# Patient Record
Sex: Female | Born: 1945 | Race: White | Hispanic: Refuse to answer | Smoking: Never smoker
Health system: Southern US, Community
[De-identification: ages and names within clinical notes are randomized; demographics above are authoritative.]

## PROBLEM LIST (undated history)

## (undated) DIAGNOSIS — M1990 Unspecified osteoarthritis, unspecified site: Secondary | ICD-10-CM

## (undated) DIAGNOSIS — K219 Gastro-esophageal reflux disease without esophagitis: Secondary | ICD-10-CM

## (undated) DIAGNOSIS — N816 Rectocele: Secondary | ICD-10-CM

## (undated) DIAGNOSIS — M810 Age-related osteoporosis without current pathological fracture: Secondary | ICD-10-CM

## (undated) DIAGNOSIS — R519 Headache, unspecified: Secondary | ICD-10-CM

## (undated) DIAGNOSIS — E785 Hyperlipidemia, unspecified: Secondary | ICD-10-CM

## (undated) DIAGNOSIS — N368 Other specified disorders of urethra: Secondary | ICD-10-CM

## (undated) DIAGNOSIS — D1803 Hemangioma of intra-abdominal structures: Secondary | ICD-10-CM

## (undated) DIAGNOSIS — F329 Major depressive disorder, single episode, unspecified: Secondary | ICD-10-CM

## (undated) DIAGNOSIS — R51 Headache: Secondary | ICD-10-CM

## (undated) DIAGNOSIS — F32A Depression, unspecified: Secondary | ICD-10-CM

## (undated) HISTORY — DX: Hyperlipidemia, unspecified: E78.5

## (undated) HISTORY — PX: RECTOCELE REPAIR: SHX761

## (undated) HISTORY — PX: BREAST REDUCTION SURGERY: SHX8

## (undated) HISTORY — PX: ABDOMINAL HYSTERECTOMY: SHX81

## (undated) HISTORY — PX: OTHER SURGICAL HISTORY: SHX169

---

## 2004-04-01 ENCOUNTER — Ambulatory Visit: Payer: Self-pay | Admitting: Pain Medicine

## 2004-06-18 ENCOUNTER — Ambulatory Visit: Payer: Self-pay | Admitting: Pain Medicine

## 2004-09-15 ENCOUNTER — Ambulatory Visit: Payer: Self-pay | Admitting: Pain Medicine

## 2004-12-08 ENCOUNTER — Ambulatory Visit: Payer: Self-pay | Admitting: Pain Medicine

## 2005-02-03 ENCOUNTER — Ambulatory Visit: Payer: Self-pay | Admitting: Pain Medicine

## 2005-02-04 ENCOUNTER — Ambulatory Visit: Payer: Self-pay | Admitting: Chiropractic Medicine

## 2005-04-06 ENCOUNTER — Ambulatory Visit: Payer: Self-pay | Admitting: Pain Medicine

## 2005-04-12 ENCOUNTER — Ambulatory Visit: Payer: Self-pay | Admitting: Pain Medicine

## 2005-05-07 ENCOUNTER — Inpatient Hospital Stay (HOSPITAL_COMMUNITY): Admission: RE | Admit: 2005-05-07 | Discharge: 2005-05-10 | Payer: Self-pay | Admitting: Neurosurgery

## 2005-06-10 ENCOUNTER — Encounter: Admission: RE | Admit: 2005-06-10 | Discharge: 2005-06-10 | Payer: Self-pay | Admitting: Neurosurgery

## 2006-01-06 ENCOUNTER — Ambulatory Visit: Payer: Self-pay | Admitting: Gastroenterology

## 2006-02-16 ENCOUNTER — Encounter: Payer: Self-pay | Admitting: Unknown Physician Specialty

## 2006-02-26 ENCOUNTER — Encounter: Payer: Self-pay | Admitting: Unknown Physician Specialty

## 2006-03-28 ENCOUNTER — Encounter: Payer: Self-pay | Admitting: Unknown Physician Specialty

## 2006-05-22 ENCOUNTER — Emergency Department: Payer: Self-pay | Admitting: Emergency Medicine

## 2006-05-22 ENCOUNTER — Other Ambulatory Visit: Payer: Self-pay

## 2006-05-23 ENCOUNTER — Ambulatory Visit: Payer: Self-pay | Admitting: Emergency Medicine

## 2006-11-15 ENCOUNTER — Ambulatory Visit: Payer: Self-pay | Admitting: Pain Medicine

## 2006-12-19 ENCOUNTER — Ambulatory Visit: Payer: Self-pay | Admitting: Pain Medicine

## 2007-01-12 ENCOUNTER — Ambulatory Visit: Payer: Self-pay | Admitting: Pain Medicine

## 2007-01-30 ENCOUNTER — Ambulatory Visit: Payer: Self-pay | Admitting: Pain Medicine

## 2007-02-16 ENCOUNTER — Ambulatory Visit: Payer: Self-pay | Admitting: Pain Medicine

## 2007-04-10 ENCOUNTER — Ambulatory Visit: Payer: Self-pay | Admitting: Pain Medicine

## 2007-05-11 ENCOUNTER — Ambulatory Visit: Payer: Self-pay | Admitting: Pain Medicine

## 2007-06-06 ENCOUNTER — Ambulatory Visit: Payer: Self-pay | Admitting: Pain Medicine

## 2007-07-03 ENCOUNTER — Ambulatory Visit: Payer: Self-pay | Admitting: Pain Medicine

## 2007-08-03 ENCOUNTER — Ambulatory Visit: Payer: Self-pay | Admitting: Pain Medicine

## 2007-08-09 ENCOUNTER — Ambulatory Visit: Payer: Self-pay | Admitting: Pain Medicine

## 2007-09-19 ENCOUNTER — Ambulatory Visit: Payer: Self-pay | Admitting: Pain Medicine

## 2007-09-27 ENCOUNTER — Ambulatory Visit: Payer: Self-pay | Admitting: Pain Medicine

## 2007-10-10 ENCOUNTER — Ambulatory Visit: Payer: Self-pay | Admitting: Pain Medicine

## 2007-11-04 ENCOUNTER — Ambulatory Visit: Payer: Self-pay | Admitting: Family Medicine

## 2007-11-07 ENCOUNTER — Ambulatory Visit: Payer: Self-pay | Admitting: Pain Medicine

## 2007-11-14 ENCOUNTER — Ambulatory Visit: Payer: Self-pay | Admitting: Pain Medicine

## 2007-12-14 ENCOUNTER — Encounter: Admission: RE | Admit: 2007-12-14 | Discharge: 2007-12-14 | Payer: Self-pay | Admitting: Orthopedic Surgery

## 2007-12-15 ENCOUNTER — Ambulatory Visit (HOSPITAL_COMMUNITY): Admission: RE | Admit: 2007-12-15 | Discharge: 2007-12-15 | Payer: Self-pay | Admitting: Radiology

## 2009-07-04 ENCOUNTER — Emergency Department: Payer: Self-pay | Admitting: Emergency Medicine

## 2010-02-16 ENCOUNTER — Ambulatory Visit: Payer: Self-pay | Admitting: Cardiovascular Disease

## 2010-02-16 DIAGNOSIS — R9431 Abnormal electrocardiogram [ECG] [EKG]: Secondary | ICD-10-CM

## 2010-02-16 DIAGNOSIS — E785 Hyperlipidemia, unspecified: Secondary | ICD-10-CM

## 2010-02-17 ENCOUNTER — Encounter: Payer: Self-pay | Admitting: Cardiovascular Disease

## 2010-02-17 ENCOUNTER — Telehealth: Payer: Self-pay | Admitting: Cardiovascular Disease

## 2010-02-24 ENCOUNTER — Encounter: Payer: Self-pay | Admitting: Cardiovascular Disease

## 2010-07-28 NOTE — Assessment & Plan Note (Signed)
Summary: NP6/AMD   Visit Type:  Initial Consult Primary Provider:  Almyra Brace.  CC:  Pre op for fusion of lumbar..  History of Present Illness: Angela Ayala is a very pleasant 65 year old woman with a history of hyperlipidemia, strong family history of coronary artery disease with her father who had his first MI at age 57 bili was a lifetime smoker. She had a fall in 2008 and since that time has had chronic back pain. She is due to have a lumbar fusion and presents for preoperative evaluation given the abnormal EKG.  Overall, she reports that she has been doing well. She denies any significant symptoms of shortness of breath, lightheadedness, chest pain. She is active and does what she can with mild limitations from her back. She denies any recent change in her ability to exert herself and states that everything is about the same. She sleeps well, On a flat surface with no orthopnea. Energy is good.  EKG in our office shows normal sinus rhythm with rate of 84 beats per minute, nonspecific ST and T wave changes in leads V3 through V6, leads 3 and aVF. These EKG changes were also seen previously in 03/2007    Preventive Screening-Counseling & Management  Alcohol-Tobacco     Smoking Status: never  Caffeine-Diet-Exercise     Does Patient Exercise: no      Drug Use:  no.    Current Medications (verified): 1)  Calcium Gluconate 500 Mg Tabs (Calcium Gluconate) .... One Tablet Two Times A Day 2)  Ra Fish Oil 1000 Mg Caps (Omega-3 Fatty Acids) .... One Tablet Two Times A Day 3)  Centrum  Tabs (Multiple Vitamins-Minerals) .... One Tablet Once Daily 4)  Nasonex 50 Mcg/act Susp (Mometasone Furoate) .... Prn 5)  Omeprazole 20 Mg Cpdr (Omeprazole) .... One Tablet Once Daily 6)  Premarin 0.625 Mg/gm Crea (Estrogens, Conjugated) .... As Needed 7)  Skelaxin 800 Mg Tabs (Metaxalone) .... 2-3 Tablets Once Daily 8)  Aspirin 81 Mg Tbec (Aspirin) .... One Tablet At Bedtime 9)  Tramadol Hcl 50 Mg  Tabs (Tramadol Hcl) .... One Tablet Every Six Hours As Needed 10)  Simvastatin 40 Mg Tabs (Simvastatin) .... One Tablet At Bedtime 11)  Meloxicam 15 Mg Tabs (Meloxicam) .... One Tablet Once Daily 12)  Imitrex 50 Mg Tabs (Sumatriptan Succinate) .... As Needed  Allergies (verified): 1)  ! Pcn  Past History:  Family History: Last updated: 02/16/2010 Family History of Coronary Artery Disease: Father MI age 36  Social History: Last updated: 02/16/2010 Tobacco Use - No.  Alcohol Use - yes Regular Exercise - no Drug Use - no Disabled --  Married   Risk Factors: Exercise: no (02/16/2010)  Risk Factors: Smoking Status: never (02/16/2010)  Past Medical History: Hyperlipidemia  Past Surgical History: fusion of lumbar L4/L5 breast reduciton  Family History: Family History of Coronary Artery Disease: Father MI age 63  Social History: Tobacco Use - No.  Alcohol Use - yes Regular Exercise - no Drug Use - no Disabled --  Married  Smoking Status:  never Does Patient Exercise:  no Drug Use:  no  Review of Systems  The patient denies fever, weight loss, weight gain, vision loss, decreased hearing, hoarseness, chest pain, syncope, dyspnea on exertion, peripheral edema, prolonged cough, abdominal pain, incontinence, muscle weakness, depression, and enlarged lymph nodes.         chronic back pain  Vital Signs:  Patient profile:   65 year old female Height:  66 inches Weight:      128 pounds BMI:     20.73 Pulse rate:   77 / minute BP sitting:   136 / 84  (left arm) Cuff size:   regular  Vitals Entered By: Bishop Dublin, CMA (February 16, 2010 2:55 PM)  Physical Exam  General:  Well developed, well nourished, in no acute distress. Head:  normocephalic and atraumatic Neck:  Neck supple, no JVD. No masses, thyromegaly or abnormal cervical nodes. Lungs:  Clear bilaterally to auscultation and percussion. Heart:  Non-displaced PMI, chest non-tender; regular rate and  rhythm, S1, S2 without murmurs, rubs or gallops. Carotid upstroke normal, no bruit.  Pedals normal pulses. No edema, no varicosities. Abdomen:   abdomen soft and non-tender without masses Msk:  Back normal, normal gait. Muscle strength and tone normal. Pulses:  pulses normal in all 4 extremities Extremities:  No clubbing or cyanosis. Neurologic:  Alert and oriented x 3. Skin:  Intact without lesions or rashes. Psych:  Normal affect.   Impression & Recommendations:  Problem # 1:  ABNORMAL EKG (ICD-794.31) EKG from 2008 and on today's visit shows nonspecific ST and T wave changes diffusely. She is asymptomatic, has good exercise tolerance. She does have some symptoms of shortness of breath with heavy exertion this has been stable. No new symptoms concerning for angina. We will not have her complete any additional workup at this time. Per the new guidelines, she would be low/acceptable risk. We have suggested that if she has symptoms in the future concerning for heart disease, she should contact our office for further evaluation.  Her updated medication list for this problem includes:    Aspirin 81 Mg Tbec (Aspirin) ..... One tablet at bedtime  Problem # 2:  HYPERLIPIDEMIA-MIXED (ICD-272.4) She has been on simvastatin for 2-3 years. She reports that her total cholesterol continues to be elevated. One option would be for her to start a stronger medication such as Crestor 20 mg daily. We have given A. co-pay assistance card and she will see if she qualifies for this. goal total cholesterol would be certainly less than 200, preferably closer to 150.  Her updated medication list for this problem includes:    Simvastatin 40 Mg Tabs (Simvastatin) ..... One tablet at bedtime  Other Orders: EKG w/ Interpretation (93000)  Patient Instructions: 1)  Your physician has recommended you make the following change in your medication:  2)  Your physician wants you to follow-up in:   You will receive  a reminder letter in the mail two months in advance. If you don't receive a letter, please call our office to schedule the follow-up appointment.

## 2010-07-28 NOTE — Letter (Signed)
Summary: PHI  PHI   Imported By: Harlon Flor 02/17/2010 14:59:09  _____________________________________________________________________  External Attachment:    Type:   Image     Comment:   External Document

## 2010-07-28 NOTE — Letter (Signed)
Summary: Medical Record Release  Medical Record Release   Imported By: Harlon Flor 03/16/2010 12:57:15  _____________________________________________________________________  External Attachment:    Type:   Image     Comment:   External Document

## 2010-07-28 NOTE — Letter (Signed)
Summary: Clearance Letter  Architectural technologist at [facility] [address]   [place], [place] [address]   Phone: [phone]  Fax: [phone]    February 17, 2010  Re:     Angela Ayala Address:   [address]     [place], [place]  [address] DOB:     02-Jun-1946 MRN:     [id number]   Dear Dr. Elvina Sidle,  The above named patient was seen in my office February 16, 2010 and has been cleared for surgery. If you have any questions you can call my office. Thank you.        Sincerely,       Dossie Arbour, MD

## 2010-07-28 NOTE — Progress Notes (Signed)
Summary: rx crestor  Phone Note Refill Request Call back at Home Phone [phone] Message from:  Patient on February 17, 2010 2:18 PM  [facility] also like samples  Initial call taken by: Harlon Flor,  February 17, 2010 2:19 PM  Follow-up for Phone Call        Rx called to pharmacy Follow-up by: Bishop Dublin, CMA,  February 17, 2010 3:22 PM    New/Updated Medications: CRESTOR 20 MG TABS (ROSUVASTATIN CALCIUM) one tablet at bedtime Prescriptions: CRESTOR 20 MG TABS (ROSUVASTATIN CALCIUM) one tablet at bedtime  #30 x 6   Entered by:   Bishop Dublin, CMA   Authorized by:   Dossie Arbour MD   Signed by:   Bishop Dublin, CMA on 02/17/2010   Method used:   Electronically to        [facility] [address] (retail)       [address]       [place], [place]  [address]       Ph: [phone]       Fax: [phone]   RxID:   [id number]

## 2010-11-13 NOTE — Op Note (Signed)
Angela Ayala, Angela Ayala NO.:  [id number]   MEDICAL RECORD NO.:  [id number]          PATIENT TYPE:  INP   LOCATION:  2899                         FACILITY:  [facility]   PHYSICIAN:  Kathaleen Maser. Pool, M.D.    DATE OF BIRTH:  1945-10-07   DATE OF PROCEDURE:  05/07/2005  DATE OF DISCHARGE:                                 OPERATIVE REPORT   SERVICE:  Neurosurgery.   PREOPERATIVE DIAGNOSES:  L4-L5 degenerative spondylolisthesis with stenosis.   POSTOPERATIVE DIAGNOSIS:  L4-L5 degenerative spondylolisthesis with  stenosis.   PROCEDURE:  L4-L5 decompressive laminectomy with foraminotomy, more than  would be required for simple interbody fusion, alone, L4-L5 posterior lumbar  interbody fusion utilizing Telamon interbody cage, local autografting and  Tangent interbody allograft wedge, L4-L5 posterolateral arthrodesis  utilizing intertransverse bone grafting utilizing local autograft and  nonsegmental pedicle screw instrumentation.   SURGEON:  Kathaleen Maser. Pool, M.D.   ASSISTANT:  Donalee Citrin, M.D.   ANESTHESIA:  General endotracheal.   INDICATIONS FOR PROCEDURE:  Angela Ayala is a 65 year old female with a history  of back and bilateral lower extremity pain failing conservative management.  Workup demonstrates evidence of an L4-L5 grade 1 degenerative  spondylolisthesis with moderately severe stenosis.  The patient has been  counseled as to her options.  She has decided to proceed with an L4-L5  decompression and fusion with instrumentation in hopes of improving  symptoms.   DESCRIPTION OF PROCEDURE:  The patient was brought to the operating room and  placed on the table in a supine position.  After an adequate level of  anesthesia was achieved, the patient was positioned prone on the Wilson  frame and properly padded for operation in the lumbar region.  She was  prepped and draped sterilely.  A 10 blade was used to make a linear skin  incision overlying the L4-L5 interspace.   This was carried down sharply in  the midline.  Subperiosteal dissection was performed exposing the lamina and  facet joints of L3, L4, L5, as well as the transverse processes of L4 and  L5.  Deep self-retaining retractors were placed.  Interoperative fluoroscopy  was used and the L4-L5 level was confirmed.  Decompressive laminectomies  were then performed Leksell rongeurs, Kerrison rongeurs, and a high speed  drill to remove the entire lamina of L4, the entire facet joint of L4  bilaterally, and the superior facet of L5 bilaterally.  Partial superior  laminectomy at L5 was also performed.  All bone is cleaned and used in later  autografting.  The ligamentum flavum was then elevated and resected in a  piecemeal fashion using the Kerrison rongeurs.  There was an underlying  synovial cyst off on the right side at L4-L5 which was also resected.  After  wide foraminotomy was performed along the course of the exiting L4 and L5  nerve roots, epidural venous plexus was coagulated and cut.  Starting first  on the patient's right side, the thecal sac and nerve roots were protected.  The disc space was incised with a 15 blade in rectangular  fashion and wide  disc space clean out was then achieved using pituitary rongeurs, up angled  pituitary rongeurs, and Epstein curets.  The discectomy was then repeated on  the contralateral side.  The disc space was then distracted up to 8 mm with  an 8 mm distractor left in the patient's right side.  The thecal sac and  nerve roots were protected on the left side.  The disc space was then reamed  and then cut with the 8 mm Tangent instruments.  Soft tissue was removed  from the interspace.  An 8 by 26 Telamon cage packed with local autograft  mixed with DBX was then impacted in place and recessed approximately 2 mm  from the posterior cortical margin.  The distractor was moved to the  patient's right side.  The thecal sac and nerve roots were protected on the   right side.  The disc space was then reamed and cut with 8 mm Tangent  instruments.  Soft tissue was once again removed from the interspace.  The  disc space was further curettaged.  Morselized autograft was then packed in  the interspace.  An 8 by 26 mm Tangent wedge was then impacted into place  and recessed approximately 2 mm posterior to the cortical margin.  The  pedicles at L4 and L5 were then identified utilizing superficial landmarks  and interoperative fluoroscopy.  The superficial bone overlying the pedicle  was then removed using the high speed drill.  Each pedicle was then probed  using pedicle awl.  Each pedicle awl track was then probed and found to be  solid in bone.  Each pedicle awl track was then tapped with a 5.25 mm screw  tap.  Each screw tap hole was probed and found to be solid in bone.  5.75 by  40 mm screws were placed bilaterally at L4, 5.75 by 35 mm screws were placed  bilaterally at L5.  The transverse process at L4 and L5 were then  decorticated using the high speed drill.  Morselized autograft was packed  posterolaterally for later fusion.  A short segment of titanium rod was then  contoured and placed through the screw head of L4 and L5.  A locking cap was  then placed over the screw heads.  The locking caps were then engaged with  the construct under compression.  Final imaging revealed good position of  the bone graft hardware.  The wound was then irrigated with antibiotic  solution.  Gelfoam was placed topically for hemostasis which was found to be  good.  A medium Hemovac drain was left in the epidural space.  The wound was  then closed in layers with Vicryl sutures.  Steri-Strips and sterile  dressing were applied.  There were no complications.  The patient tolerated  the procedure well and she was returned to the recovery room in stable  condition.           ______________________________ Kathaleen Maser Pool, M.D.     HAP/MEDQ  D:  05/07/2005  T:   05/07/2005  Job:  [id number]

## 2010-11-13 NOTE — Discharge Summary (Signed)
NAMECAPRINA, Angela Ayala                  ACCOUNT NO.:  [id number]   MEDICAL RECORD NO.:  [id number]          PATIENT TYPE:  INP   LOCATION:  3003                         FACILITY:  [facility]   PHYSICIAN:  Kathaleen Maser. Pool, M.D.    DATE OF BIRTH:  1945-12-06   DATE OF ADMISSION:  05/07/2005  DATE OF DISCHARGE:  05/10/2005                                 DISCHARGE SUMMARY   FINAL DIAGNOSIS:  L4-5 degenerative spondylolisthesis with stenosis.   HISTORY OF PRESENT ILLNESS:  [name] is a 65 year old female with a  history of back and bilateral lower extremity pain failing conservative  management.  She presents now for L4-5 decompression and fusion for  treatment of a degenerative spondylolisthesis.   HOSPITAL COURSE:  The patient was taken to the operating room where an  uncomplicated L4-5 decompression and fusion were performed.  Postoperatively, the patient did well.  Her activity level was gradually  increased.  At the time of discharge, she is ambulating without difficulty.  Preoperative pain was much improved.  Strength and sensation were intact.  The wound was healing well.   CONDITION ON DISCHARGE:  Improved.   DISCHARGE DISPOSITION:  The patient will follow up in one week in my office.           ______________________________  Kathaleen Maser. Pool, M.D.     HAP/MEDQ  D:  07/06/2005  T:  07/06/2005  Job:  [id number]

## 2011-01-14 ENCOUNTER — Encounter: Payer: Self-pay | Admitting: Cardiovascular Disease

## 2011-07-15 ENCOUNTER — Ambulatory Visit: Payer: Self-pay | Admitting: Gastroenterology

## 2011-09-09 ENCOUNTER — Ambulatory Visit: Payer: Self-pay | Admitting: Physical Medicine and Rehabilitation

## 2012-07-12 ENCOUNTER — Ambulatory Visit: Payer: Self-pay | Admitting: Gastroenterology

## 2013-02-13 DIAGNOSIS — M222X9 Patellofemoral disorders, unspecified knee: Secondary | ICD-10-CM | POA: Insufficient documentation

## 2013-05-14 DIAGNOSIS — G8929 Other chronic pain: Secondary | ICD-10-CM | POA: Insufficient documentation

## 2013-05-14 DIAGNOSIS — M79601 Pain in right arm: Secondary | ICD-10-CM | POA: Insufficient documentation

## 2013-06-29 ENCOUNTER — Ambulatory Visit: Payer: Self-pay | Admitting: Medical

## 2013-06-29 LAB — RAPID INFLUENZA A&B ANTIGENS

## 2014-01-21 DIAGNOSIS — K5900 Constipation, unspecified: Secondary | ICD-10-CM | POA: Insufficient documentation

## 2014-01-21 DIAGNOSIS — IMO0002 Reserved for concepts with insufficient information to code with codable children: Secondary | ICD-10-CM | POA: Insufficient documentation

## 2014-01-21 DIAGNOSIS — N816 Rectocele: Secondary | ICD-10-CM | POA: Insufficient documentation

## 2014-02-20 DIAGNOSIS — M51369 Other intervertebral disc degeneration, lumbar region without mention of lumbar back pain or lower extremity pain: Secondary | ICD-10-CM | POA: Insufficient documentation

## 2014-02-20 DIAGNOSIS — M5136 Other intervertebral disc degeneration, lumbar region: Secondary | ICD-10-CM | POA: Insufficient documentation

## 2014-02-20 DIAGNOSIS — M5416 Radiculopathy, lumbar region: Secondary | ICD-10-CM | POA: Insufficient documentation

## 2014-12-18 ENCOUNTER — Other Ambulatory Visit: Payer: Self-pay | Admitting: Orthopedic Surgery

## 2014-12-18 DIAGNOSIS — M542 Cervicalgia: Secondary | ICD-10-CM

## 2014-12-18 DIAGNOSIS — IMO0001 Reserved for inherently not codable concepts without codable children: Secondary | ICD-10-CM

## 2014-12-18 DIAGNOSIS — R209 Unspecified disturbances of skin sensation: Secondary | ICD-10-CM

## 2014-12-24 ENCOUNTER — Ambulatory Visit
Admission: RE | Admit: 2014-12-24 | Discharge: 2014-12-24 | Disposition: A | Discharge status: Home / Self Care | Payer: Medicare Other | Source: Ambulatory Visit | Attending: Orthopedic Surgery | Admitting: Orthopedic Surgery

## 2014-12-24 DIAGNOSIS — R209 Unspecified disturbances of skin sensation: Secondary | ICD-10-CM

## 2014-12-24 DIAGNOSIS — M542 Cervicalgia: Secondary | ICD-10-CM | POA: Diagnosis present

## 2014-12-24 DIAGNOSIS — IMO0001 Reserved for inherently not codable concepts without codable children: Secondary | ICD-10-CM

## 2014-12-24 DIAGNOSIS — M4802 Spinal stenosis, cervical region: Secondary | ICD-10-CM | POA: Insufficient documentation

## 2015-01-07 ENCOUNTER — Other Ambulatory Visit: Payer: Self-pay | Admitting: *Deleted

## 2016-03-11 ENCOUNTER — Encounter: Admission: RE | Payer: Self-pay | Source: Ambulatory Visit

## 2016-03-11 ENCOUNTER — Ambulatory Visit
Admission: RE | Admit: 2016-03-11 | Payer: Medicare Other | Source: Ambulatory Visit | Admitting: Unknown Physician Specialty

## 2016-03-11 SURGERY — COLONOSCOPY WITH PROPOFOL
Anesthesia: General

## 2016-04-08 ENCOUNTER — Encounter: Payer: Self-pay | Admitting: *Deleted

## 2016-04-12 ENCOUNTER — Encounter: Payer: Self-pay | Admitting: *Deleted

## 2016-04-12 ENCOUNTER — Ambulatory Visit
Admission: RE | Admit: 2016-04-12 | Discharge: 2016-04-12 | Disposition: A | Discharge status: Home / Self Care | Payer: Medicare Other | Source: Ambulatory Visit | Attending: Unknown Physician Specialty | Admitting: Unknown Physician Specialty

## 2016-04-12 ENCOUNTER — Ambulatory Visit: Payer: Medicare Other | Admitting: Anesthesiology

## 2016-04-12 ENCOUNTER — Encounter
Admission: RE | Disposition: A | Discharge status: Home / Self Care | Payer: Self-pay | Source: Ambulatory Visit | Attending: Unknown Physician Specialty

## 2016-04-12 DIAGNOSIS — D123 Benign neoplasm of transverse colon: Secondary | ICD-10-CM | POA: Diagnosis not present

## 2016-04-12 DIAGNOSIS — D122 Benign neoplasm of ascending colon: Secondary | ICD-10-CM | POA: Insufficient documentation

## 2016-04-12 DIAGNOSIS — K2970 Gastritis, unspecified, without bleeding: Secondary | ICD-10-CM | POA: Insufficient documentation

## 2016-04-12 DIAGNOSIS — Z7982 Long term (current) use of aspirin: Secondary | ICD-10-CM | POA: Diagnosis not present

## 2016-04-12 DIAGNOSIS — T184XXA Foreign body in colon, initial encounter: Secondary | ICD-10-CM | POA: Insufficient documentation

## 2016-04-12 DIAGNOSIS — Z79899 Other long term (current) drug therapy: Secondary | ICD-10-CM | POA: Diagnosis not present

## 2016-04-12 DIAGNOSIS — K648 Other hemorrhoids: Secondary | ICD-10-CM | POA: Insufficient documentation

## 2016-04-12 DIAGNOSIS — M1990 Unspecified osteoarthritis, unspecified site: Secondary | ICD-10-CM | POA: Diagnosis not present

## 2016-04-12 DIAGNOSIS — E785 Hyperlipidemia, unspecified: Secondary | ICD-10-CM | POA: Diagnosis not present

## 2016-04-12 DIAGNOSIS — K219 Gastro-esophageal reflux disease without esophagitis: Secondary | ICD-10-CM | POA: Diagnosis not present

## 2016-04-12 DIAGNOSIS — X58XXXA Exposure to other specified factors, initial encounter: Secondary | ICD-10-CM | POA: Diagnosis not present

## 2016-04-12 DIAGNOSIS — F329 Major depressive disorder, single episode, unspecified: Secondary | ICD-10-CM | POA: Diagnosis not present

## 2016-04-12 DIAGNOSIS — Z8371 Family history of colonic polyps: Secondary | ICD-10-CM | POA: Insufficient documentation

## 2016-04-12 DIAGNOSIS — Z1211 Encounter for screening for malignant neoplasm of colon: Secondary | ICD-10-CM | POA: Insufficient documentation

## 2016-04-12 HISTORY — DX: Headache: R51

## 2016-04-12 HISTORY — DX: Hemangioma of intra-abdominal structures: D18.03

## 2016-04-12 HISTORY — DX: Major depressive disorder, single episode, unspecified: F32.9

## 2016-04-12 HISTORY — DX: Unspecified osteoarthritis, unspecified site: M19.90

## 2016-04-12 HISTORY — DX: Other specified disorders of urethra: N36.8

## 2016-04-12 HISTORY — PX: ESOPHAGOGASTRODUODENOSCOPY (EGD) WITH PROPOFOL: SHX5813

## 2016-04-12 HISTORY — DX: Age-related osteoporosis without current pathological fracture: M81.0

## 2016-04-12 HISTORY — DX: Depression, unspecified: F32.A

## 2016-04-12 HISTORY — PX: COLONOSCOPY WITH PROPOFOL: SHX5780

## 2016-04-12 HISTORY — DX: Rectocele: N81.6

## 2016-04-12 HISTORY — DX: Headache, unspecified: R51.9

## 2016-04-12 HISTORY — DX: Hyperlipidemia, unspecified: E78.5

## 2016-04-12 HISTORY — DX: Gastro-esophageal reflux disease without esophagitis: K21.9

## 2016-04-12 SURGERY — ESOPHAGOGASTRODUODENOSCOPY (EGD) WITH PROPOFOL
Anesthesia: General

## 2016-04-12 MED ORDER — LIDOCAINE HCL (CARDIAC) 20 MG/ML IV SOLN
INTRAVENOUS | Status: DC | PRN
  Administered 2016-04-12: 30 mg via INTRAVENOUS

## 2016-04-12 MED ORDER — EPHEDRINE SULFATE 50 MG/ML IJ SOLN
INTRAMUSCULAR | Status: DC | PRN
  Administered 2016-04-12 (×2): 5 mg via INTRAVENOUS

## 2016-04-12 MED ORDER — SODIUM CHLORIDE 0.9 % IV SOLN
INTRAVENOUS | Status: DC
  Administered 2016-04-12: 10:00:00 via INTRAVENOUS

## 2016-04-12 MED ORDER — FENTANYL CITRATE (PF) 100 MCG/2ML IJ SOLN
INTRAMUSCULAR | Status: DC | PRN
  Administered 2016-04-12: 50 ug via INTRAVENOUS

## 2016-04-12 MED ORDER — PROPOFOL 500 MG/50ML IV EMUL
INTRAVENOUS | Status: DC | PRN
  Administered 2016-04-12: 120 ug/kg/min via INTRAVENOUS

## 2016-04-12 MED ORDER — MIDAZOLAM HCL 2 MG/2ML IJ SOLN
INTRAMUSCULAR | Status: DC | PRN
Start: 2016-04-12 — End: 2016-04-12
  Administered 2016-04-12: 1 mg via INTRAVENOUS

## 2016-04-12 MED ORDER — SODIUM CHLORIDE 0.9 % IV SOLN: INTRAVENOUS | Status: DC

## 2016-04-12 NOTE — Op Note (Signed)
[facility] Gastroenterology Patient Name: Angela Ayala Procedure Date: 04/12/2016 10:40 AM MRN: [id number] Account #: [id number] Date of Birth: 09/10/1945 Admit Type: Outpatient Age: 70 Room: [facility] ENDO ROOM 4 Gender: Female Note Status: Finalized Procedure:            Upper GI endoscopy Indications:          Heartburn Providers:            Manya Silvas, MD Referring MD:         Bo Mcclintock. Vickki Muff (Referring MD) Medicines:            Propofol per Anesthesia Complications:        No immediate complications. Procedure:            Pre-Anesthesia Assessment:                       - After reviewing the risks and benefits, the patient                        was deemed in satisfactory condition to undergo the                        procedure.                       After obtaining informed consent, the endoscope was                        passed under direct vision. Throughout the procedure,                        the patient's blood pressure, pulse, and oxygen                        saturations were monitored continuously. The Endoscope                        was introduced through the mouth, and advanced to the                        second part of duodenum. The upper GI endoscopy was                        accomplished without difficulty. The patient tolerated                        the procedure well. Findings:      Localized mild erythema was found at the gastroesophageal junction and       in the cardia. Biopsies were taken with a cold forceps for histology.       GEJ at 39cm.      Diffuse and patchy minimal inflammation characterized by erythema and       granularity was found in the gastric body and in the gastric antrum.       Biopsies were taken with a cold forceps for histology. Biopsies were       taken with a cold forceps for Helicobacter pylori testing.      The examined duodenum was normal. Impression:           - Erythema at the gastroesophageal  junction and in the  cardia. Biopsied.                       - Gastritis. Biopsied.                       - Normal examined duodenum. Recommendation:       - Await pathology results. Manya Silvas, MD 04/12/2016 10:55:00 AM This report has been signed electronically. Number of Addenda: 0 Note Initiated On: 04/12/2016 10:40 AM      [facility]

## 2016-04-12 NOTE — H&P (Signed)
Primary Care Physician:  Clarisse Gouge, MD Primary Gastroenterologist:  Dr. Vira Agar  Pre-Procedure History & Physical: HPI:  Angela Ayala is a 70 y.o. female is here for an endoscopy and colonoscopy.   Past Medical History:  Diagnosis Date  . Arthritis   . Depression   . DJD (degenerative joint disease)   . GERD (gastroesophageal reflux disease)   . Headache    Migraines  . Hepatic hemangioma   . HLD (hyperlipidemia)   . Hyperlipidemia   . Osteoporosis   . Rectocele   . Urethral prolapse     Past Surgical History:  Procedure Laterality Date  . ABDOMINAL HYSTERECTOMY    . BREAST REDUCTION SURGERY    . fusion of lumbar     L4/5  . RECTOCELE REPAIR      Prior to Admission medications   Medication Sig Start Date End Date Taking? Authorizing Provider  aspirin (ASPIR-81) 81 MG EC tablet Take 81 mg by mouth at bedtime.     Yes Historical Provider, MD  carbamazepine (CARBATROL) 100 MG 12 hr capsule Take 100 mg by mouth 2 (two) times daily.   Yes Historical Provider, MD  gabapentin (NEURONTIN) 600 MG tablet Take 600 mg by mouth 4 (four) times daily as needed.   Yes Historical Provider, MD  metaxalone (SKELAXIN) 800 MG tablet Take 800 mg by mouth daily. Take 2-3 tabs    Yes Historical Provider, MD  Multiple Vitamin (MULTIVITAMIN) tablet Take 1 tablet by mouth daily.   Yes Historical Provider, MD  Omega-3 Fatty Acids (RA FISH OIL) 1000 MG CAPS Take by mouth 2 (two) times daily.     Yes Historical Provider, MD  omeprazole (PRILOSEC) 20 MG capsule Take 20 mg by mouth daily.     Yes Historical Provider, MD  pantoprazole (PROTONIX) 40 MG tablet Take 40 mg by mouth 2 (two) times daily.   Yes Historical Provider, MD  simvastatin (ZOCOR) 40 MG tablet Take 40 mg by mouth daily.   Yes Historical Provider, MD  sucralfate (CARAFATE) 1 GM/10ML suspension Take 1 g by mouth 3 (three) times daily with meals.   Yes Historical Provider, MD  valACYclovir (VALTREX) 1000 MG tablet Take 1,000 mg by  mouth daily.   Yes Historical Provider, MD  venlafaxine (EFFEXOR) 75 MG tablet Take 75 mg by mouth 2 (two) times daily with a meal.   Yes Historical Provider, MD  calcium gluconate 500 MG tablet Take 500 mg by mouth daily.      Historical Provider, MD  cyclobenzaprine (FLEXERIL) 10 MG tablet Take 10 mg by mouth 3 (three) times daily as needed for muscle spasms.    Historical Provider, MD  estrogens, conjugated, (PREMARIN) 0.625 MG tablet Take 0.625 mg by mouth daily. Take daily for 21 days then do not take for 7 days.     Historical Provider, MD  meloxicam (MOBIC) 15 MG tablet Take 15 mg by mouth daily.      Historical Provider, MD  mometasone (NASONEX) 50 MCG/ACT nasal spray Place 2 sprays into the nose as needed.      Historical Provider, MD  rosuvastatin (CRESTOR) 20 MG tablet Take 20 mg by mouth at bedtime.      Historical Provider, MD  SUMAtriptan (IMITREX) 50 MG tablet Take 50 mg by mouth as needed.      Historical Provider, MD  traMADol (ULTRAM) 50 MG tablet Take 50 mg by mouth every 6 (six) hours as needed.      Historical Provider,  MD    Allergies as of 02/19/2016 - Reviewed 02/16/2010  Allergen Reaction Noted  . Penicillins  02/16/2010    Family History  Problem Relation Age of Onset  . Heart attack Father 55  . Coronary artery disease Other     Social History   Social History  . Marital status: Married    Spouse name: N/A  . Number of children: N/A  . Years of education: N/A   Occupational History  . Not on file.   Social History Main Topics  . Smoking status: Never Smoker  . Smokeless tobacco: Never Used     Comment: tobacco use- no   . Alcohol use Yes  . Drug use: No  . Sexual activity: Not on file   Other Topics Concern  . Not on file   Social History Narrative   Disabled.     Review of Systems:Patient swallowed a crown from teeth, will try to find it. See HPI, otherwise negative ROS  Physical Exam: BP 122/77   Pulse 79   Temp 97.1 F (36.2 C)  (Tympanic)   Resp 18   Ht 5\' 5"  (1.651 m)   Wt 59 kg (130 lb)   SpO2 100%   BMI 21.63 kg/m  General:   Alert,  pleasant and cooperative in NAD Head:  Normocephalic and atraumatic. Neck:  Supple; no masses or thyromegaly. Lungs:  Clear throughout to auscultation.    Heart:  Regular rate and rhythm. Abdomen:  Soft, nontender and nondistended. Normal bowel sounds, without guarding, and without rebound.   Neurologic:  Alert and  oriented x4;  grossly normal neurologically.  Impression/Plan: Angela Ayala is here for an endoscopy and colonoscopy to be performed for GERD, screening due to Rincon colon polyps and change in bowel habits  Risks, benefits, limitations, and alternatives regarding  endoscopy and colonoscopy have been reviewed with the patient.  Questions have been answered.  All parties agreeable.   Gaylyn Cheers, MD  04/12/2016, 10:35 AM

## 2016-04-12 NOTE — Transfer of Care (Signed)
Immediate Anesthesia Transfer of Care Note  Patient: Angela Ayala  Procedure(s) Performed: Procedure(s): ESOPHAGOGASTRODUODENOSCOPY (EGD) WITH PROPOFOL (N/A) COLONOSCOPY WITH PROPOFOL (N/A)  Patient Location: PACU  Anesthesia Type:General  Level of Consciousness: awake and sedated  Airway & Oxygen Therapy: Patient Spontanous Breathing and Patient connected to nasal cannula oxygen  Post-op Assessment: Report given to RN and Post -op Vital signs reviewed and stable  Post vital signs: Reviewed and stable  Last Vitals:  Vitals:   04/12/16 1009  BP: 122/77  Pulse: 79  Resp: 18  Temp: 36.2 C    Last Pain:  Vitals:   04/12/16 1009  TempSrc: Tympanic         Complications: No apparent anesthesia complications

## 2016-04-12 NOTE — Op Note (Signed)
[facility] Gastroenterology Patient Name: Angela Ayala Procedure Date: 04/12/2016 10:39 AM MRN: [id number] Account #: [id number] Date of Birth: 11/21/1945 Admit Type: Outpatient Age: 70 Room: [facility] ENDO ROOM 4 Gender: Female Note Status: Finalized Procedure:            Colonoscopy Indications:          Colon cancer screening in patient at increased risk:                        Family history of 1st-degree relative with colon polyps Providers:            Manya Silvas, MD Referring MD:         Bo Mcclintock. Vickki Muff (Referring MD) Medicines:            Propofol per Anesthesia Complications:        No immediate complications. Procedure:            Pre-Anesthesia Assessment:                       - After reviewing the risks and benefits, the patient                        was deemed in satisfactory condition to undergo the                        procedure.                       After obtaining informed consent, the colonoscope was                        passed under direct vision. Throughout the procedure,                        the patient's blood pressure, pulse, and oxygen                        saturations were monitored continuously. The                        Colonoscope was introduced through the anus and                        advanced to the the cecum, identified by appendiceal                        orifice and ileocecal valve. The colonoscopy was                        performed without difficulty. The patient tolerated the                        procedure well. The quality of the bowel preparation                        was good. Findings:      A small polyp was found in the ascending colon. The polyp was sessile.       The polyp was removed with a hot snare. Resection and retrieval were       complete.  Internal hemorrhoids were found during endoscopy. The hemorrhoids were       small and Grade I (internal hemorrhoids that do not prolapse).      A  foreign body was found in the ascending colon. Removal of gold tooth       cap via Jabier Mutton basket was accomplished with a Roth net.      A diminutive polyp was found in the cecum. The polyp was sessile. The       polyp was removed with a cold snare. Resection and retrieval were       complete.      The exam was otherwise without abnormality. Impression:           - One small polyp in the ascending colon, removed with                        a hot snare. Resected and retrieved.                       - Internal hemorrhoids.                       - Foreign body in the ascending colon. Removal was                        successful. Recommendation:       - Await pathology results. Gave gold cap to family. Manya Silvas, MD 04/12/2016 11:45:33 AM This report has been signed electronically. Number of Addenda: 0 Note Initiated On: 04/12/2016 10:39 AM Scope Withdrawal Time: 0 hours 30 minutes 46 seconds  Total Procedure Duration: 0 hours 43 minutes 7 seconds       [facility]

## 2016-04-12 NOTE — OR Nursing (Signed)
Dr. Vira Agar sent patient to [facility] to have eye checked.

## 2016-04-12 NOTE — Anesthesia Preprocedure Evaluation (Signed)
Anesthesia Evaluation  Patient identified by MRN, date of birth, ID band Patient awake    Reviewed: Allergy & Precautions, H&P , NPO status , Patient's Chart, lab work & pertinent test results  History of Anesthesia Complications Negative for: history of anesthetic complications  Airway Mallampati: III  TM Distance: <3 FB Neck ROM: limited    Dental  (+) Poor Dentition, Chipped, Caps   Pulmonary neg shortness of breath,    Pulmonary exam normal breath sounds clear to auscultation       Cardiovascular Exercise Tolerance: Good (-) angina(-) Past MI and (-) DOE negative cardio ROS Normal cardiovascular exam Rhythm:regular Rate:Normal     Neuro/Psych  Headaches, PSYCHIATRIC DISORDERS Depression negative psych ROS   GI/Hepatic Neg liver ROS, GERD  Controlled,  Endo/Other  negative endocrine ROS  Renal/GU negative Renal ROS  negative genitourinary   Musculoskeletal  (+) Arthritis ,   Abdominal   Peds  Hematology negative hematology ROS (+)   Anesthesia Other Findings Reports that she had a cap fall off her tooth yesterday  Past Medical History: No date: Arthritis No date: Depression No date: DJD (degenerative joint disease) No date: GERD (gastroesophageal reflux disease) No date: Headache     Comment: Migraines No date: Hepatic hemangioma No date: HLD (hyperlipidemia) No date: Hyperlipidemia No date: Osteoporosis No date: Rectocele 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  Past Surgical History: No date: ABDOMINAL HYSTERECTOMY No date: BREAST REDUCTION SURGERY No date: fusion of lumbar     Comment: L4/5 No date: RECTOCELE REPAIR     Reproductive/Obstetrics negative OB ROS                             Anesthesia Physical Anesthesia Plan  ASA: III  Anesthesia Plan: General   Post-op Pain Management:    Induction:   Airway Management Planned:   Additional Equipment:   Intra-op  Plan:   Post-operative Plan:   Informed Consent: I have reviewed the patients History and Physical, chart, labs and discussed the procedure including the risks, benefits and alternatives for the proposed anesthesia with the patient or authorized representative who has indicated his/her understanding and acceptance.   Dental Advisory Given  Plan Discussed with: Anesthesiologist, CRNA and Surgeon  Anesthesia Plan Comments:         Anesthesia Quick Evaluation

## 2016-04-12 NOTE — Anesthesia Procedure Notes (Signed)
Performed by: COOK-MARTIN, Zakariah Dejarnette Pre-anesthesia Checklist: Patient identified, Emergency Drugs available, Suction available, Patient being monitored and Timeout performed Patient Re-evaluated:Patient Re-evaluated prior to inductionOxygen Delivery Method: Nasal cannula Preoxygenation: Pre-oxygenation with 100% oxygen Intubation Type: IV induction Airway Equipment and Method: Bite block Placement Confirmation: CO2 detector and positive ETCO2     

## 2016-04-12 NOTE — Anesthesia Postprocedure Evaluation (Signed)
Anesthesia Post Note  Patient: MARCAYLA BURKEY  Procedure(s) Performed: Procedure(s) (LRB): ESOPHAGOGASTRODUODENOSCOPY (EGD) WITH PROPOFOL (N/A) COLONOSCOPY WITH PROPOFOL (N/A)  Patient location during evaluation: Endoscopy Anesthesia Type: General Level of consciousness: awake and alert Pain management: pain level controlled Vital Signs Assessment: post-procedure vital signs reviewed and stable Respiratory status: spontaneous breathing, nonlabored ventilation, respiratory function stable and patient connected to nasal cannula oxygen Cardiovascular status: blood pressure returned to baseline and stable Postop Assessment: no signs of nausea or vomiting Anesthetic complications: no    Last Vitals:  Vitals:   04/12/16 1210 04/12/16 1220  BP: 114/74 (!) 143/77  Pulse: 69 65  Resp: 12 11  Temp:      Last Pain:  Vitals:   04/12/16 1220  TempSrc:   PainSc: 8                  Joseph K Piscitello

## 2016-04-13 LAB — SURGICAL PATHOLOGY

## 2016-04-17 ENCOUNTER — Encounter: Payer: Self-pay | Admitting: Unknown Physician Specialty

## 2017-03-02 DIAGNOSIS — K580 Irritable bowel syndrome with diarrhea: Secondary | ICD-10-CM | POA: Insufficient documentation

## 2017-05-12 DIAGNOSIS — F329 Major depressive disorder, single episode, unspecified: Secondary | ICD-10-CM | POA: Insufficient documentation

## 2017-05-12 DIAGNOSIS — F419 Anxiety disorder, unspecified: Secondary | ICD-10-CM | POA: Insufficient documentation

## 2017-05-24 DIAGNOSIS — K635 Polyp of colon: Secondary | ICD-10-CM | POA: Insufficient documentation

## 2017-10-28 DIAGNOSIS — R413 Other amnesia: Secondary | ICD-10-CM | POA: Insufficient documentation

## 2017-10-28 DIAGNOSIS — G255 Other chorea: Secondary | ICD-10-CM | POA: Insufficient documentation

## 2017-11-13 DIAGNOSIS — G3184 Mild cognitive impairment, so stated: Secondary | ICD-10-CM | POA: Insufficient documentation

## 2017-11-13 DIAGNOSIS — K219 Gastro-esophageal reflux disease without esophagitis: Secondary | ICD-10-CM | POA: Insufficient documentation

## 2018-03-03 DIAGNOSIS — R1084 Generalized abdominal pain: Secondary | ICD-10-CM | POA: Insufficient documentation

## 2018-03-03 DIAGNOSIS — R140 Abdominal distension (gaseous): Secondary | ICD-10-CM | POA: Insufficient documentation

## 2018-05-18 ENCOUNTER — Ambulatory Visit
Admission: EM | Admit: 2018-05-18 | Discharge: 2018-05-18 | Disposition: A | Discharge status: Home / Self Care | Payer: Medicare Other | Attending: Family Medicine | Admitting: Family Medicine

## 2018-05-18 ENCOUNTER — Other Ambulatory Visit: Payer: Self-pay

## 2018-05-18 ENCOUNTER — Encounter: Payer: Self-pay | Admitting: Emergency Medicine

## 2018-05-18 DIAGNOSIS — R42 Dizziness and giddiness: Secondary | ICD-10-CM

## 2018-05-18 DIAGNOSIS — I10 Essential (primary) hypertension: Secondary | ICD-10-CM | POA: Diagnosis not present

## 2018-05-18 MED ORDER — MECLIZINE HCL 25 MG PO TABS: 25.0000 mg | ORAL_TABLET | Freq: Three times a day (TID) | ORAL | 0 refills | Status: AC | PRN

## 2018-05-18 NOTE — ED Provider Notes (Signed)
[facility]    CSN: [id number] Arrival date & time: 05/18/18  1631  History   Chief Complaint Chief Complaint  Patient presents with  . Dizziness   HPI  72 year old female presents with dizziness.   2-day history of dizziness.  Started after she started taking lisinopril for hypertension.  Has improved somewhat since she has stopped the medication.  Associated fatigue.  Patient states that her dizziness is worse with movement.  She feels like things are spinning.  She had some nausea as well.  Additionally, patient reports chronic constipation which she is followed by gastroneurology for.  No other associated symptoms.  No other complaint.  PMH, Surgical Hx, Family Hx, Social History reviewed and updated as below.  Past Medical History:  Diagnosis Date  . Arthritis   . Depression   . DJD (degenerative joint disease)   . GERD (gastroesophageal reflux disease)   . Headache    Migraines  . Hepatic hemangioma   . HLD (hyperlipidemia)   . Hyperlipidemia   . Osteoporosis   . Rectocele   . Urethral prolapse     Patient Active Problem List   Diagnosis Date Noted  . HYPERLIPIDEMIA-MIXED 02/16/2010  . ABNORMAL EKG 02/16/2010    Past Surgical History:  Procedure Laterality Date  . ABDOMINAL HYSTERECTOMY    . BREAST REDUCTION SURGERY    . COLONOSCOPY WITH PROPOFOL N/A 04/12/2016   Procedure: COLONOSCOPY WITH PROPOFOL;  Surgeon: Manya Silvas, MD;  Location: [facility] ENDOSCOPY;  Service: Endoscopy;  Laterality: N/A;  . ESOPHAGOGASTRODUODENOSCOPY (EGD) WITH PROPOFOL N/A 04/12/2016   Procedure: ESOPHAGOGASTRODUODENOSCOPY (EGD) WITH PROPOFOL;  Surgeon: Manya Silvas, MD;  Location: [facility] ENDOSCOPY;  Service: Endoscopy;  Laterality: N/A;  . fusion of lumbar     L4/5  . RECTOCELE REPAIR      OB History   None      Home Medications    Prior to Admission medications   Medication Sig Start Date End Date Taking? Authorizing Provider  aspirin (ASPIR-81) 81 MG  EC tablet Take 81 mg by mouth at bedtime.     Yes [provider]  calcium gluconate 500 MG tablet Take 500 mg by mouth daily.     Yes [provider]  carbamazepine (CARBATROL) 100 MG 12 hr capsule Take 100 mg by mouth 2 (two) times daily.   Yes [provider]  cyclobenzaprine (FLEXERIL) 10 MG tablet Take 10 mg by mouth 3 (three) times daily as needed for muscle spasms.   Yes [provider]  gabapentin (NEURONTIN) 600 MG tablet Take 600 mg by mouth 4 (four) times daily as needed.   Yes [provider]  meloxicam (MOBIC) 15 MG tablet Take 15 mg by mouth daily.     Yes [provider]  metaxalone (SKELAXIN) 800 MG tablet Take 800 mg by mouth daily. Take 2-3 tabs    Yes [provider]  mometasone (NASONEX) 50 MCG/ACT nasal spray Place 2 sprays into the nose as needed.     Yes [provider]  Multiple Vitamin (MULTIVITAMIN) tablet Take 1 tablet by mouth daily.   Yes [provider]  Omega-3 Fatty Acids (RA FISH OIL) 1000 MG CAPS Take by mouth 2 (two) times daily.     Yes [provider]  pantoprazole (PROTONIX) 40 MG tablet Take 40 mg by mouth 2 (two) times daily.   Yes [provider]  simvastatin (ZOCOR) 40 MG tablet Take 40 mg by mouth daily.   Yes  [provider]  sucralfate (CARAFATE) 1 GM/10ML suspension Take 1 g by mouth 3 (three) times daily with meals.   Yes [provider]  SUMAtriptan (IMITREX) 50 MG tablet Take 50 mg by mouth as needed.     Yes [provider]  valACYclovir (VALTREX) 1000 MG tablet Take 1,000 mg by mouth daily.   Yes [provider]  venlafaxine (EFFEXOR) 75 MG tablet Take 75 mg by mouth 2 (two) times daily with a meal.   Yes [provider]  meclizine (ANTIVERT) 25 MG tablet Take 1 tablet (25 mg total) by mouth 3 (three) times daily as needed for dizziness. 05/18/18   Coral Spikes, DO    Family History Family History   Problem Relation Age of Onset  . Heart attack Father 11  . Hypertension Father   . Stroke Father   . Coronary artery disease Other   . Other Mother        accident    Social History Social History   Tobacco Use  . Smoking status: Never Smoker  . Smokeless tobacco: Never Used  . Tobacco comment: tobacco use- no   Substance Use Topics  . Alcohol use: Yes    Alcohol/week: 7.0 standard drinks    Types: 7 Cans of beer per week  . Drug use: No     Allergies   Penicillins   Review of Systems Review of Systems  Gastrointestinal: Positive for constipation and nausea.  Neurological: Positive for dizziness.   Physical Exam Triage Vital Signs ED Triage Vitals  Enc Vitals Group     BP 05/18/18 1648 (!) 114/94     Pulse Rate 05/18/18 1648 83     Resp 05/18/18 1648 16     Temp 05/18/18 1648 97.7 F (36.5 C)     Temp Source 05/18/18 1648 Oral     SpO2 05/18/18 1648 97 %     Weight 05/18/18 1649 125 lb (56.7 kg)     Height 05/18/18 1649 5\' 5"  (1.651 m)     Head Circumference --      Peak Flow --      Pain Score 05/18/18 1648 4     Pain Loc --      Pain Edu? --      Excl. in [place]? --    Updated Vital Signs BP (!) 114/94 (BP Location: Left Arm)   Pulse 83   Temp 97.7 F (36.5 C) (Oral)   Resp 16   Ht 5\' 5"  (1.651 m)   Wt 56.7 kg   SpO2 97%   BMI 20.80 kg/m   Visual Acuity Right Eye Distance:   Left Eye Distance:   Bilateral Distance:    Right Eye Near:   Left Eye Near:    Bilateral Near:     Physical Exam  Constitutional: She is oriented to person, place, and time. She appears well-developed. No distress.  HENT:  Head: Normocephalic and atraumatic.  Eyes: Pupils are equal, round, and reactive to light. EOM are normal.  Cardiovascular: Normal rate and regular rhythm.  Pulmonary/Chest: Effort normal and breath sounds normal. She has no wheezes. She has no rales.  Neurological: She is alert and oriented to person, place, and time.  Psychiatric: She has a  normal mood and affect. Her behavior is normal.  Nursing note and vitals reviewed.  UC Treatments / Results  Labs (all labs ordered are listed, but only abnormal results are displayed) Labs Reviewed - No data to display  EKG None  Radiology No results found.  Procedures Procedures (including critical care time)  Medications Ordered in UC Medications - No data to display  Initial Impression / Assessment and Plan / UC Course  I have reviewed the triage vital signs and the nursing notes.  Pertinent labs & imaging results that were available during my care of the patient were reviewed by me and considered in my medical decision making (see chart for details).    72 year old female presents with suspected vertigo.  Stop lisinopril.  Meclizine as needed.  Advised somatic citrate for constipation as she was previously directed to do.  Final Clinical Impressions(s) / UC Diagnoses   Final diagnoses:  Dizziness     Discharge Instructions     Rest.  Medication as directed.  Follow up with PCP.  Take care  Dr. Lacinda Axon    ED Prescriptions    Medication Sig Dispense Auth. Provider   meclizine (ANTIVERT) 25 MG tablet Take 1 tablet (25 mg total) by mouth 3 (three) times daily as needed for dizziness. 30 tablet Coral Spikes, DO     Controlled Substance Prescriptions [place] Controlled Substance Registry consulted? Not Applicable   Coral Spikes, DO 05/18/18 1844

## 2018-05-18 NOTE — ED Triage Notes (Signed)
Patient in today c/o dizziness and fatigue x 2 days. Patient started a new medication (Lisinopril) on Tuesday (05/16/18) and the dizziness and fatigue started the next day. Patient did not take Lisinopril Wednesday, but symptoms are worse. Patient states the dizziness is constant and nausea when she closes her eyes or rides in the car.

## 2018-05-18 NOTE — Discharge Instructions (Signed)
Rest.  Medication as directed.  Follow up with PCP.  Take care  Dr. Lacinda Axon

## 2018-05-22 ENCOUNTER — Other Ambulatory Visit: Payer: Self-pay

## 2018-05-22 ENCOUNTER — Encounter: Payer: Self-pay | Admitting: Emergency Medicine

## 2018-05-22 ENCOUNTER — Ambulatory Visit
Admission: EM | Admit: 2018-05-22 | Discharge: 2018-05-22 | Disposition: A | Discharge status: Home / Self Care | Payer: Medicare Other | Attending: Family Medicine | Admitting: Family Medicine

## 2018-05-22 DIAGNOSIS — R42 Dizziness and giddiness: Secondary | ICD-10-CM | POA: Diagnosis not present

## 2018-05-22 LAB — COMPREHENSIVE METABOLIC PANEL
ALK PHOS: 80 U/L (ref 38–126)
ALT: 22 U/L (ref 0–44)
ANION GAP: 9 (ref 5–15)
AST: 28 U/L (ref 15–41)
Albumin: 3.9 g/dL (ref 3.5–5.0)
BUN: 26 mg/dL — AB (ref 8–23)
CHLORIDE: 106 mmol/L (ref 98–111)
CO2: 27 mmol/L (ref 22–32)
CREATININE: 0.77 mg/dL (ref 0.44–1.00)
Calcium: 9.1 mg/dL (ref 8.9–10.3)
GFR calc non Af Amer: >60 mL/min (ref >60)
GLUCOSE: 129 mg/dL — AB (ref 70–99)
Potassium: 3.7 mmol/L (ref 3.5–5.1)
SODIUM: 142 mmol/L (ref 135–145)
Total Bilirubin: 0.3 mg/dL (ref 0.3–1.2)
Total Protein: 7.4 g/dL (ref 6.5–8.1)

## 2018-05-22 LAB — CBC WITH DIFFERENTIAL/PLATELET
Abs Immature Granulocytes: 0.01 10*3/uL (ref 0.00–0.07)
BASOS ABS: 0 10*3/uL (ref 0.0–0.1)
BASOS PCT: 0 %
EOS ABS: 0.2 10*3/uL (ref 0.0–0.5)
EOS PCT: 3 %
HCT: 45.9 % (ref 36.0–46.0)
HEMOGLOBIN: 15.1 g/dL — AB (ref 12.0–15.0)
Immature Granulocytes: 0 %
LYMPHS PCT: 27 %
Lymphs Abs: 1.8 10*3/uL (ref 0.7–4.0)
MCH: 30.7 pg (ref 26.0–34.0)
MCHC: 32.9 g/dL (ref 30.0–36.0)
MCV: 93.3 fL (ref 80.0–100.0)
MONO ABS: 0.8 10*3/uL (ref 0.1–1.0)
Monocytes Relative: 12 %
NRBC: 0 % (ref 0.0–0.2)
Neutro Abs: 3.7 10*3/uL (ref 1.7–7.7)
Neutrophils Relative %: 58 %
Platelets: 229 10*3/uL (ref 150–400)
RBC: 4.92 MIL/uL (ref 3.87–5.11)
RDW: 12.9 % (ref 11.5–15.5)
WBC: 6.5 10*3/uL (ref 4.0–10.5)

## 2018-05-22 NOTE — ED Provider Notes (Signed)
[facility]  CSN: [id number] Arrival date & time: 05/22/18  1233  History   Chief Complaint Chief Complaint  Patient presents with  . Dizziness    APPT  . Constipation   HPI  73 year old female present with dizziness.   Patient reports continued dizziness.  She continues to describe it as lightheadedness and feeling as if things are spinning.  Has had no improvement on meclizine.  She is had some nausea and one episode of emesis over the weekend.  She has not restarted lisinopril.  No other medication changes have occurred.  Patient continues to have chronic constipation.  This is not a big concern to her at this point time she is primarily plagued by the dizziness.  Worse with positional changes.  No relieving factors.  No other complaints  Past Medical History:  Diagnosis Date  . Arthritis   . Depression   . DJD (degenerative joint disease)   . GERD (gastroesophageal reflux disease)   . Headache    Migraines  . Hepatic hemangioma   . HLD (hyperlipidemia)   . Hyperlipidemia   . Osteoporosis   . Rectocele   . Urethral prolapse    Patient Active Problem List   Diagnosis Date Noted  . HYPERLIPIDEMIA-MIXED 02/16/2010  . ABNORMAL EKG 02/16/2010   Past Surgical History:  Procedure Laterality Date  . ABDOMINAL HYSTERECTOMY    . BREAST REDUCTION SURGERY    . COLONOSCOPY WITH PROPOFOL N/A 04/12/2016   Procedure: COLONOSCOPY WITH PROPOFOL;  Surgeon: Manya Silvas, MD;  Location: [facility] ENDOSCOPY;  Service: Endoscopy;  Laterality: N/A;  . ESOPHAGOGASTRODUODENOSCOPY (EGD) WITH PROPOFOL N/A 04/12/2016   Procedure: ESOPHAGOGASTRODUODENOSCOPY (EGD) WITH PROPOFOL;  Surgeon: Manya Silvas, MD;  Location: [facility] ENDOSCOPY;  Service: Endoscopy;  Laterality: N/A;  . fusion of lumbar     L4/5  . RECTOCELE REPAIR     OB History   None    Home Medications    Prior to Admission medications   Medication Sig Start Date End Date Taking? Authorizing Provider  aspirin  (ASPIR-81) 81 MG EC tablet Take 81 mg by mouth at bedtime.     Yes [provider]  calcium gluconate 500 MG tablet Take 500 mg by mouth daily.     Yes [provider]  carbamazepine (CARBATROL) 100 MG 12 hr capsule Take 100 mg by mouth 2 (two) times daily.   Yes [provider]  gabapentin (NEURONTIN) 600 MG tablet Take 600 mg by mouth 4 (four) times daily as needed.   Yes [provider]  meclizine (ANTIVERT) 25 MG tablet Take 1 tablet (25 mg total) by mouth 3 (three) times daily as needed for dizziness. 05/18/18  Yes Ginnifer Creelman G, DO  meloxicam (MOBIC) 15 MG tablet Take 15 mg by mouth daily.     Yes [provider]  mometasone (NASONEX) 50 MCG/ACT nasal spray Place 2 sprays into the nose as needed.     Yes [provider]  Multiple Vitamin (MULTIVITAMIN) tablet Take 1 tablet by mouth daily.   Yes [provider]  Omega-3 Fatty Acids (RA FISH OIL) 1000 MG CAPS Take by mouth 2 (two) times daily.     Yes [provider]  pantoprazole (PROTONIX) 40 MG tablet Take 40 mg by mouth 2 (two) times daily.   Yes [provider]  rosuvastatin (CRESTOR) 40 MG tablet Take by mouth. 05/15/18 05/15/19 Yes [provider]  sucralfate (CARAFATE) 1 GM/10ML suspension Take 1 g  by mouth 3 (three) times daily with meals.   Yes [provider]  SUMAtriptan (IMITREX) 50 MG tablet Take 50 mg by mouth as needed.     Yes [provider]  valACYclovir (VALTREX) 1000 MG tablet Take 1,000 mg by mouth daily.   Yes [provider]  venlafaxine (EFFEXOR) 75 MG tablet Take 75 mg by mouth 2 (two) times daily with a meal.   Yes [provider]    Family History Family History  Problem Relation Age of Onset  . Heart attack Father 59  . Hypertension Father   . Stroke Father   . Coronary artery disease Other   . Other Mother        accident    Social History Social History   Tobacco Use  .  Smoking status: Never Smoker  . Smokeless tobacco: Never Used  . Tobacco comment: tobacco use- no   Substance Use Topics  . Alcohol use: Yes    Alcohol/week: 7.0 standard drinks    Types: 7 Cans of beer per week  . Drug use: No     Allergies   Penicillins   Review of Systems Review of Systems  Gastrointestinal: Positive for constipation.  Neurological: Positive for dizziness.   Physical Exam Triage Vital Signs ED Triage Vitals  Enc Vitals Group     BP 05/22/18 1308 91/79     Pulse Rate 05/22/18 1308 82     Resp 05/22/18 1308 16     Temp 05/22/18 1308 (!) 97.5 F (36.4 C)     Temp Source 05/22/18 1308 Oral     SpO2 05/22/18 1308 100 %     Weight 05/22/18 1308 118 lb (53.5 kg)     Height 05/22/18 1308 5\' 5"  (1.651 m)     Head Circumference --      Peak Flow --      Pain Score 05/22/18 1307 0     Pain Loc --      Pain Edu? --      Excl. in [place]? --    Updated Vital Signs BP 91/79 (BP Location: Left Arm)   Pulse 82   Temp (!) 97.5 F (36.4 C) (Oral)   Resp 16   Ht 5\' 5"  (1.651 m)   Wt 53.5 kg   SpO2 100%   BMI 19.64 kg/m   Visual Acuity Right Eye Distance:   Left Eye Distance:   Bilateral Distance:    Right Eye Near:   Left Eye Near:    Bilateral Near:     Physical Exam  Constitutional: She is oriented to person, place, and time. She appears well-developed. No distress.  HENT:  Head: Normocephalic and atraumatic.  Eyes: Pupils are equal, round, and reactive to light. Conjunctivae are normal.  Cardiovascular: Normal rate and regular rhythm.  Pulmonary/Chest: Effort normal and breath sounds normal. She has no wheezes. She has no rales.  Neurological: She is alert and oriented to person, place, and time.  Psychiatric: She has a normal mood and affect. Her behavior is normal.  Nursing note and vitals reviewed.  UC Treatments / Results  Labs (all labs ordered are listed, but only abnormal results are displayed) Labs Reviewed  CBC WITH  DIFFERENTIAL/PLATELET - Abnormal; Notable for the following components:      Result Value   Hemoglobin 15.1 (*)    All other components within normal limits  COMPREHENSIVE METABOLIC PANEL - Abnormal; Notable for the following components:   Glucose, Bld 129 (*)  BUN 26 (*)    All other components within normal limits    EKG None  Radiology No results found.  Procedures Procedures (including critical care time)  Medications Ordered in UC Medications - No data to display  Initial Impression / Assessment and Plan / UC Course  I have reviewed the triage vital signs and the nursing notes.  Pertinent labs & imaging results that were available during my care of the patient were reviewed by me and considered in my medical decision making (see chart for details).    72 year old female present with vertigo. Referring to vestibular rehab.  Labs essentially unremarkable.   Final Clinical Impressions(s) / UC Diagnoses   Final diagnoses:  Vertigo     Discharge Instructions     Lots and lots of fluids.  Rest.  Call for an appt for vestibular rehab.  Take care  Dr. Lacinda Axon    ED Prescriptions    None     Controlled Substance Prescriptions [place] Controlled Substance Registry consulted? Not Applicable   Coral Spikes, DO 05/22/18 1755

## 2018-05-22 NOTE — ED Triage Notes (Signed)
Patient in today c/o continued vertigo. Patient was seen on 05/18/18 and prescribed Meclizine, which is not helping.  Patient also c/o "blocked bowel" x months. Patient is being treated by a gastroenterologist for this, but is concerned if this may be contributing to her dizziness.

## 2018-05-22 NOTE — Discharge Instructions (Addendum)
Lots and lots of fluids.  Rest.  Call for an appt for vestibular rehab.  Take care  Dr. Lacinda Axon

## 2019-01-03 ENCOUNTER — Other Ambulatory Visit: Payer: Self-pay | Admitting: Family Medicine

## 2019-01-03 DIAGNOSIS — Z1231 Encounter for screening mammogram for malignant neoplasm of breast: Secondary | ICD-10-CM

## 2019-02-14 ENCOUNTER — Other Ambulatory Visit: Payer: Self-pay

## 2019-02-14 ENCOUNTER — Ambulatory Visit
Admission: RE | Admit: 2019-02-14 | Discharge: 2019-02-14 | Disposition: A | Discharge status: Home / Self Care | Payer: Medicare Other | Source: Ambulatory Visit | Attending: Gastroenterology | Admitting: Gastroenterology

## 2019-02-14 ENCOUNTER — Ambulatory Visit
Admission: RE | Admit: 2019-02-14 | Discharge: 2019-02-14 | Disposition: A | Discharge status: Home / Self Care | Payer: Medicare Other | Attending: Gastroenterology | Admitting: Gastroenterology

## 2019-02-14 ENCOUNTER — Ambulatory Visit (INDEPENDENT_AMBULATORY_CARE_PROVIDER_SITE_OTHER): Payer: Medicare Other | Admitting: Gastroenterology

## 2019-02-14 ENCOUNTER — Encounter: Payer: Self-pay | Admitting: Gastroenterology

## 2019-02-14 ENCOUNTER — Encounter (INDEPENDENT_AMBULATORY_CARE_PROVIDER_SITE_OTHER): Payer: Self-pay

## 2019-02-14 VITALS — BP 131/87 | HR 72 | Temp 97.2°F | Resp 17 | Wt 121.4 lb

## 2019-02-14 DIAGNOSIS — K582 Mixed irritable bowel syndrome: Secondary | ICD-10-CM

## 2019-02-14 MED ORDER — DICYCLOMINE HCL 10 MG PO CAPS: 10.0000 mg | ORAL_CAPSULE | Freq: Three times a day (TID) | ORAL | 0 refills | Status: DC

## 2019-02-14 NOTE — Progress Notes (Signed)
Cephas Darby, MD [address]  [place]  [place], [place] [address]  Main: [phone]  Fax: [phone]    Gastroenterology Consultation  Referring Provider:     Clarisse Gouge, MD Primary Care Physician:  Clarisse Gouge, MD Primary Gastroenterologist:  Dr. Cephas Darby Reason for Consultation:     Irritable bowel syndrome        HPI:   Angela Ayala is a 73 y.o. female referred by Dr. Vickki Muff, Frederic Jericho, MD  for consultation & management of irritable bowel syndrome.  She has been experiencing several years history of abdominal bloating, generalized abdominal cramps and postprandial urgency with nonbloody soft stools.  She has seen 3 different gastroenterologists prior to this visit, at [facility], [place] [facility].  She was told that she has constipation with overflow diarrhea.  She underwent anorectal manometry at [facility] which was unremarkable for pelvic floor dyssynergia.  She tried antibiotics in the past cannot recollect the name.  She was also suggested low FODMAPs diet.  Patient underwent EGD and colonoscopy which were unremarkable.  Celiac serologies were negative.  TSH normal.  Her weight has been stable.  She had CT abdomen as well as x-ray KUB in 2018 and was found to have significant stool burden.  She has been treated as such with MiraLAX, Linzess, Amitiza, Metamucil which were not effective.  She said Linzess and Amitiza did not provide any benefit, caused severe cramps.  The only medication that helped for her is Dulcolax that has resulted in complete emptying and complete relief of her symptoms for about 2 to 3 days before the cycle starts again.  She is trying to eliminate roughage foods.  She does not smoke, drinks alcohol occasionally.  Does not drink sodas, does not chew gum.  She takes Protonix in the morning, famotidine at bedtime.  She is concerned that the symptoms are significantly impacting her daily life, unable to go out or perform her daily activities.   Her most concerning symptoms are abdominal cramps and postprandial bowel frequency.  She denies any stress in her life, lives with her husband  NSAIDs: None  Antiplts/Anticoagulants/Anti thrombotics: None  GI Procedures:   EGD 07/12/2012 Diagnosis:  GASTROESOPHAGEAL JUNCTION BIOPSIES:  - SQUAMOCOLUMNAR-JUNCTION MUCOSA WITH MILD CHRONIC ACTIVE  INFLAMMATION.  - NO INTESTINAL METAPLASIA SEEN IN THIS SAMPLE.  EGD and colonoscopy by Dr. Vira Agar 03/2016  - Erythema at the gastroesophageal junction and in the cardia. Biopsied. - Gastritis. Biopsied. - Normal examined duodenum.  - One small polyp in the ascending colon, removed with a hot snare. Resected and retrieved. - Internal hemorrhoids. - Foreign body in the ascending colon. Removal was successful.  DIAGNOSIS:  A. STOMACH, ANTRUM AND BODY; COLD BIOPSY:  - ANTRAL AND OXYNTIC MUCOSA WITH FOCAL MILD REACTIVE FOVEOLAR  HYPERPLASIA.  - NEGATIVE FOR H. PYLORI, DYSPLASIA, AND MALIGNANCY.   B. GEJ; COLD BIOPSY:  - GASTRIC CARDIAC TYPE MUCOSA WITH CHANGES COMPATIBLE WITH REFLUX.  - SQUAMOUS MUCOSA IS NOT PRESENT FOR EVALUATION.  - NEGATIVE FOR DYSPLASIA AND MALIGNANCY.   C. COLON POLYP, CECUM; COLD SNARE AND COLD BIOPSY:  - TUBULAR ADENOMA.  - NEGATIVE FOR HIGH GRADE DYSPLASIAAND MALIGNANCY.   D. COLON POLYP, ASCENDING; HOT SNARE:  - TUBULAR ADENOMA.  - SESSILE SERRATED ADENOMA.  - NEGATIVE FOR HIGH GRADE DYSPLASIA AND MALIGNANCY.    Past Medical History:  Diagnosis Date   Arthritis    Depression    DJD (degenerative joint disease)  GERD (gastroesophageal reflux disease)    Headache    Migraines   Hepatic hemangioma    HLD (hyperlipidemia)    Hyperlipidemia    Osteoporosis    Rectocele    Urethral prolapse     Past Surgical History:  Procedure Laterality Date   ABDOMINAL HYSTERECTOMY     BREAST REDUCTION SURGERY     COLONOSCOPY WITH PROPOFOL N/A 04/12/2016   Procedure: COLONOSCOPY WITH  PROPOFOL;  Surgeon: Manya Silvas, MD;  Location: [facility];  Service: Endoscopy;  Laterality: N/A;   ESOPHAGOGASTRODUODENOSCOPY (EGD) WITH PROPOFOL N/A 04/12/2016   Procedure: ESOPHAGOGASTRODUODENOSCOPY (EGD) WITH PROPOFOL;  Surgeon: Manya Silvas, MD;  Location: [facility] ENDOSCOPY;  Service: Endoscopy;  Laterality: N/A;   fusion of lumbar     L4/5   RECTOCELE REPAIR       Current Outpatient Medications:    aspirin (ASPIR-81) 81 MG EC tablet, Take 81 mg by mouth at bedtime.  , Disp: , Rfl:    bisacodyl (DULCOLAX) 5 MG EC tablet, Take 1 to 2 every 3 days as needed, Disp: , Rfl:    calcium carbonate (OS-CAL) 1250 (500 Ca) MG chewable tablet, Chew by mouth., Disp: , Rfl:    calcium gluconate 500 MG tablet, Take 500 mg by mouth daily.  , Disp: , Rfl:    carbamazepine (CARBATROL) 100 MG 12 hr capsule, Take 100 mg by mouth 2 (two) times daily., Disp: , Rfl:    conjugated estrogens (PREMARIN) vaginal cream, Apply topically., Disp: , Rfl:    cyclobenzaprine (FLEXERIL) 10 MG tablet, Take 10 mg by mouth 3 (three) times daily as needed. for muscle spams, Disp: , Rfl:    Estradiol 10 MCG TABS vaginal tablet, Insert 1 tablet vaginally 2 times a week, Disp: , Rfl:    gabapentin (NEURONTIN) 600 MG tablet, Take 600 mg by mouth 4 (four) times daily as needed., Disp: , Rfl:    meclizine (ANTIVERT) 25 MG tablet, Take 1 tablet (25 mg total) by mouth 3 (three) times daily as needed for dizziness., Disp: 30 tablet, Rfl: 0   mometasone (NASONEX) 50 MCG/ACT nasal spray, Place 2 sprays into the nose as needed.  , Disp: , Rfl:    pantoprazole (PROTONIX) 40 MG tablet, Take 40 mg by mouth 2 (two) times daily., Disp: , Rfl:    rosuvastatin (CRESTOR) 40 MG tablet, Take by mouth., Disp: , Rfl:    SUMAtriptan (IMITREX) 50 MG tablet, Take 50 mg by mouth as needed.  , Disp: , Rfl:    valACYclovir (VALTREX) 1000 MG tablet, Take 1,000 mg by mouth daily., Disp: , Rfl:    venlafaxine (EFFEXOR) 75 MG  tablet, Take 75 mg by mouth 2 (two) times daily with a meal., Disp: , Rfl:    dicyclomine (BENTYL) 10 MG capsule, Take 1 capsule (10 mg total) by mouth 4 (four) times daily -  before meals and at bedtime for 30 doses., Disp: 30 capsule, Rfl: 0    Family History  Problem Relation Age of Onset   Heart attack Father 53   Hypertension Father    Stroke Father    Coronary artery disease Other    Other Mother        accident     Social History   Tobacco Use   Smoking status: Never Smoker   Smokeless tobacco: Never Used   Tobacco comment: tobacco use- no   Substance Use Topics   Alcohol use: Yes    Alcohol/week: 7.0 standard drinks  Types: 7 Cans of beer per week   Drug use: No    Allergies as of 02/14/2019 - Review Complete 02/14/2019  Allergen Reaction Noted   Penicillins  02/16/2010   Rofecoxib Other (See Comments) 04/27/2017    Review of Systems:    All systems reviewed and negative except where noted in HPI.   Physical Exam:  BP 131/87 (BP Location: Left Arm, Patient Position: Sitting, Cuff Size: Normal)    Pulse 72    Temp (!) 97.2 F (36.2 C)    Resp 17    Wt 121 lb 6.4 oz (55.1 kg)    BMI 20.20 kg/m  No LMP recorded. Patient has had a hysterectomy.  General:   Alert,  Well-developed, well-nourished, pleasant and cooperative in NAD Head:  Normocephalic and atraumatic. Eyes:  Sclera clear, no icterus.   Conjunctiva pink. Ears:  Normal auditory acuity. Nose:  No deformity, discharge, or lesions. Mouth:  No deformity or lesions,oropharynx pink & moist. Neck:  Supple; no masses or thyromegaly. Lungs:  Respirations even and unlabored.  Clear throughout to auscultation.   No wheezes, crackles, or rhonchi. No acute distress. Heart:  Regular rate and rhythm; no murmurs, clicks, rubs, or gallops. Abdomen:  Normal bowel sounds. Soft, non-tender and mildly distended, tympanic to percussion without masses, hepatosplenomegaly or hernias noted.  No guarding or  rebound tenderness.   Rectal: Not performed Msk:  Symmetrical without gross deformities. Good, equal movement & strength bilaterally. Pulses:  Normal pulses noted. Extremities:  No clubbing or edema.  No cyanosis. Neurologic:  Alert and oriented x3;  grossly normal neurologically. Skin:  Intact without significant lesions or rashes. No jaundice. Psych:  Alert and cooperative. Normal mood and affect.  Imaging Studies: Reviewed  Assessment and Plan:   Angela Ayala is a 73 y.o. female with no significant past medical history seen in consultation for irritable bowel syndrome.  Her symptoms are suggestive of mixed type.  Work-up has been negative so far.  She does not have other alarm signs or symptoms  Take a bottle of magnesium citrate today Try Bentyl 10 mg before each meal and at bedtime Trial of Trulance X-ray KUB If above therapies fail, will give empiric trial of rifaximin for 2 days   Follow up in 2 to 3 weeks   Cephas Darby, MD

## 2019-02-14 NOTE — Patient Instructions (Signed)
Magnesium Citrate oral solution What is this medicine? MAGNESIUM CITRATE (mag NEE zee um SI treyt) is a saline laxative. It is used to treat occasional constipation, but it should not be used regularly for this purpose. This medicine may be used for other purposes; ask your health care provider or pharmacist if you have questions. COMMON BRAND NAME(S): Citroma What should I tell my health care provider before I take this medicine? They need to know if you have any of these conditions:  are on a low magnesium or low sodium diet  change in bowel habits for 2 weeks  colostomy or ileostomy  constipation after using another laxative for 7 days  diabetes  kidney disease  rectal bleeding  stomach pain, nausea, or vomiting  an unusual or allergic reaction to magnesium citrate, other magnesium products, other medicines, foods, dyes, or preservatives  pregnant or trying to get pregnant  breast-feeding How should I use this medicine? Take this medicine by mouth. Follow the directions on the package or prescription label. Use a specially marked spoon or container to measure each dose. Ask your pharmacist if you do not have one. Household spoons are not accurate. Drink a full glass of fluid with each dose of this medicine. This medicine may taste better if it is chilled before you drink it. Do not take your medicine more often than directed. Talk to your pediatrician regarding the use of this medicine in children. While this drug may be prescribed for children as young as 2 years of age for selected conditions, precautions do apply. Overdosage: If you think you have taken too much of this medicine contact a poison control center or emergency room at once. NOTE: This medicine is only for you. Do not share this medicine with others. What if I miss a dose? This does not apply; this medicine is not for regular use. What may interact with this medicine?  cellulose sodium  phosphate  digoxin  edetate disodium, EDTA  medicines for bone strength like etidronate, ibandronate, risedronate  sodium polystyrene sulfonate  some antibiotics like ciprofloxacin, doxycycline, gatifloxacin, levofloxacin, tetracycline  vitamin D This list may not describe all possible interactions. Give your health care provider a list of all the medicines, herbs, non-prescription drugs, or dietary supplements you use. Also tell them if you smoke, drink alcohol, or use illegal drugs. Some items may interact with your medicine. What should I watch for while using this medicine? Tell your doctor or healthcare professional if your symptoms do not start to get better or if they get worse. Do not take any other medicine by mouth within 2 hours of taking this medicine. What side effects may I notice from receiving this medicine? Side effects that you should report to your doctor or health care professional as soon as possible:  allergic reactions like skin rash, itching or hives, swelling of the face, lips, or tongue  breathing problems  chest pain  fast, irregular heartbeat  muscle weakness  nausea or vomiting Side effects that usually do not require medical attention (report to your doctor or health care professional if they continue or are bothersome):  diarrhea  stomach upset This list may not describe all possible side effects. Call your doctor for medical advice about side effects. You may report side effects to FDA at 1-800-FDA-1088. Where should I keep my medicine? Keep out of the reach of children. Store at room temperature or in the refrigerator between 8 and 30 degrees C (46 and 86 degrees F).   Throw away any unused medicine 24 hours after opening the bottle. Throw away unopened bottles of medicine after the expiration date. NOTE: This sheet is a summary. It may not cover all possible information. If you have questions about this medicine, talk to your doctor, pharmacist,  or health care provider.  2020 Elsevier/Gold Standard (2008-01-02 17:27:50)  

## 2019-02-19 ENCOUNTER — Telehealth: Payer: Self-pay | Admitting: Gastroenterology

## 2019-02-19 NOTE — Telephone Encounter (Signed)
Pt left vm she was given samples of Trulance and need prescription send to pharmacy

## 2019-02-21 ENCOUNTER — Encounter: Payer: Self-pay | Admitting: Gastroenterology

## 2019-02-21 ENCOUNTER — Telehealth: Payer: Self-pay | Admitting: Gastroenterology

## 2019-02-21 NOTE — Telephone Encounter (Signed)
Pt left vm she has a question regarding her rx Trulance please call pt

## 2019-02-22 ENCOUNTER — Other Ambulatory Visit: Payer: Self-pay | Admitting: Gastroenterology

## 2019-02-22 DIAGNOSIS — K582 Mixed irritable bowel syndrome: Secondary | ICD-10-CM

## 2019-02-22 MED ORDER — LUBIPROSTONE 24 MCG PO CAPS: 24.0000 ug | ORAL_CAPSULE | Freq: Two times a day (BID) | ORAL | 0 refills | Status: DC

## 2019-03-08 ENCOUNTER — Telehealth: Payer: Self-pay | Admitting: Gastroenterology

## 2019-03-08 ENCOUNTER — Encounter: Payer: Self-pay | Admitting: Gastroenterology

## 2019-03-08 NOTE — Telephone Encounter (Signed)
Pt left vm she missed a call and would like a return call

## 2019-03-08 NOTE — Telephone Encounter (Signed)
Returned patient's MyChart message regarding severe constipation.  She started Amitiza about a week ago, she had 1 bowel movement today but continues to feel bloated.  Her insurance would not pay for Trulance.  Suggested her to continue Amitiza twice daily and see if she continues to have bowel movements.  If Amitiza does not work by early next week, will try linaclotide 290 MCG daily.  Patient will contact me via my chart   Cephas Darby, MD [address]  [place]  [place], [place] [address]  Main: [phone]  Fax: [phone] Pager: [phone]

## 2019-03-14 ENCOUNTER — Ambulatory Visit (INDEPENDENT_AMBULATORY_CARE_PROVIDER_SITE_OTHER): Payer: Medicare Other | Admitting: Gastroenterology

## 2019-03-14 ENCOUNTER — Other Ambulatory Visit: Payer: Self-pay

## 2019-03-14 ENCOUNTER — Encounter: Payer: Self-pay | Admitting: Gastroenterology

## 2019-03-14 VITALS — BP 111/70 | HR 87 | Temp 98.9°F | Ht 65.5 in | Wt 124.0 lb

## 2019-03-14 DIAGNOSIS — R140 Abdominal distension (gaseous): Secondary | ICD-10-CM | POA: Diagnosis not present

## 2019-03-14 DIAGNOSIS — K582 Mixed irritable bowel syndrome: Secondary | ICD-10-CM

## 2019-03-14 MED ORDER — METRONIDAZOLE 500 MG PO TABS: 500.0000 mg | ORAL_TABLET | Freq: Two times a day (BID) | ORAL | 0 refills | Status: AC

## 2019-03-14 MED ORDER — LUBIPROSTONE 24 MCG PO CAPS: 24.0000 ug | ORAL_CAPSULE | Freq: Two times a day (BID) | ORAL | 2 refills | Status: AC

## 2019-03-14 NOTE — Progress Notes (Signed)
Cephas Darby, MD [address]  [place]  [place], [place] [address]  Main: [phone]  Fax: [phone]    Gastroenterology Consultation  Referring Provider:     Clarisse Gouge, MD Primary Care Physician:  Clarisse Gouge, MD Primary Gastroenterologist:  Dr. Cephas Darby Reason for Consultation:     Irritable bowel syndrome        HPI:   Angela Ayala is a 73 y.o. female referred by Dr. Vickki Muff, Frederic Jericho, MD  for consultation & management of irritable bowel syndrome.  She has been experiencing several years history of abdominal bloating, generalized abdominal cramps and postprandial urgency with nonbloody soft stools.  She has seen 3 different gastroenterologists prior to this visit, at [facility], [place] [facility].  She was told that she has constipation with overflow diarrhea.  She underwent anorectal manometry at [facility] which was unremarkable for pelvic floor dyssynergia.  She tried antibiotics in the past cannot recollect the name.  She was also suggested low FODMAPs diet.  Patient underwent EGD and colonoscopy which were unremarkable.  Celiac serologies were negative.  TSH normal.  Her weight has been stable.  She had CT abdomen as well as x-ray KUB in 2018 and was found to have significant stool burden.  She has been treated as such with MiraLAX, Linzess, Amitiza, Metamucil which were not effective.  She said Linzess and Amitiza did not provide any benefit, caused severe cramps.  The only medication that helped for her is Dulcolax that has resulted in complete emptying and complete relief of her symptoms for about 2 to 3 days before the cycle starts again.  She is trying to eliminate roughage foods.  She does not smoke, drinks alcohol occasionally.  Does not drink sodas, does not chew gum.  She takes Protonix in the morning, famotidine at bedtime.  She is concerned that the symptoms are significantly impacting her daily life, unable to go out or perform her daily activities.   Her most concerning symptoms are abdominal cramps and postprandial bowel frequency.  She denies any stress in her life, lives with her husband  Follow-up visit 03/14/2019 Patient is here for her follow-up of constipation predominant IBS.  She reported that the Trulance samples helped.  However, she could not afford prescription.  She is currently on Amitiza 24 MCG twice daily and reports having loose bowel movements associated with mucus.  Her main concern is significant abdominal bloating associated with low back pain.  She notices that her GI symptoms occur when she has to attend a meeting or going out.  She also stopped going to labs as they were more stressful.  She gets distracted and her GI symptoms disappear when she watches TV, reading books or knitting.  She is currently seeing a psychologist for management of anxiety, distractibility.  NSAIDs: None  Antiplts/Anticoagulants/Anti thrombotics: None  GI Procedures:   EGD 07/12/2012 Diagnosis:  GASTROESOPHAGEAL JUNCTION BIOPSIES:  - SQUAMOCOLUMNAR-JUNCTION MUCOSA WITH MILD CHRONIC ACTIVE  INFLAMMATION.  - NO INTESTINAL METAPLASIA SEEN IN THIS SAMPLE.  EGD and colonoscopy by Dr. Vira Agar 03/2016  - Erythema at the gastroesophageal junction and in the cardia. Biopsied. - Gastritis. Biopsied. - Normal examined duodenum.  - One small polyp in the ascending colon, removed with a hot snare. Resected and retrieved. - Internal hemorrhoids. - Foreign body in the ascending colon. Removal was successful.  DIAGNOSIS:  A. STOMACH, ANTRUM AND BODY; COLD BIOPSY:  - ANTRAL AND OXYNTIC MUCOSA WITH FOCAL  MILD REACTIVE FOVEOLAR  HYPERPLASIA.  - NEGATIVE FOR H. PYLORI, DYSPLASIA, AND MALIGNANCY.   B. GEJ; COLD BIOPSY:  - GASTRIC CARDIAC TYPE MUCOSA WITH CHANGES COMPATIBLE WITH REFLUX.  - SQUAMOUS MUCOSA IS NOT PRESENT FOR EVALUATION.  - NEGATIVE FOR DYSPLASIA AND MALIGNANCY.   C. COLON POLYP, CECUM; COLD SNARE AND COLD BIOPSY:  - TUBULAR  ADENOMA.  - NEGATIVE FOR HIGH GRADE DYSPLASIAAND MALIGNANCY.   D. COLON POLYP, ASCENDING; HOT SNARE:  - TUBULAR ADENOMA.  - SESSILE SERRATED ADENOMA.  - NEGATIVE FOR HIGH GRADE DYSPLASIA AND MALIGNANCY.    Past Medical History:  Diagnosis Date   Arthritis    Depression    DJD (degenerative joint disease)    GERD (gastroesophageal reflux disease)    Headache    Migraines   Hepatic hemangioma    HLD (hyperlipidemia)    Hyperlipidemia    Osteoporosis    Rectocele    Urethral prolapse     Past Surgical History:  Procedure Laterality Date   ABDOMINAL HYSTERECTOMY     BREAST REDUCTION SURGERY     COLONOSCOPY WITH PROPOFOL N/A 04/12/2016   Procedure: COLONOSCOPY WITH PROPOFOL;  Surgeon: Manya Silvas, MD;  Location: [facility] ENDOSCOPY;  Service: Endoscopy;  Laterality: N/A;   ESOPHAGOGASTRODUODENOSCOPY (EGD) WITH PROPOFOL N/A 04/12/2016   Procedure: ESOPHAGOGASTRODUODENOSCOPY (EGD) WITH PROPOFOL;  Surgeon: Manya Silvas, MD;  Location: [facility] ENDOSCOPY;  Service: Endoscopy;  Laterality: N/A;   fusion of lumbar     L4/5   RECTOCELE REPAIR       Current Outpatient Medications:    aspirin (ASPIR-81) 81 MG EC tablet, Take 81 mg by mouth at bedtime.  , Disp: , Rfl:    calcium carbonate (OS-CAL) 1250 (500 Ca) MG chewable tablet, Chew by mouth., Disp: , Rfl:    calcium gluconate 500 MG tablet, Take 500 mg by mouth daily.  , Disp: , Rfl:    carbamazepine (CARBATROL) 100 MG 12 hr capsule, Take 100 mg by mouth 2 (two) times daily., Disp: , Rfl:    conjugated estrogens (PREMARIN) vaginal cream, Apply topically., Disp: , Rfl:    cyclobenzaprine (FLEXERIL) 10 MG tablet, Take 10 mg by mouth 3 (three) times daily as needed. for muscle spams, Disp: , Rfl:    Estradiol 10 MCG TABS vaginal tablet, Insert 1 tablet vaginally 2 times a week, Disp: , Rfl:    gabapentin (NEURONTIN) 600 MG tablet, Take 600 mg by mouth 4 (four) times daily as needed., Disp: , Rfl:     lubiprostone (AMITIZA) 24 MCG capsule, Take 1 capsule (24 mcg total) by mouth 2 (two) times daily with a meal., Disp: 60 capsule, Rfl: 2   meclizine (ANTIVERT) 25 MG tablet, Take 1 tablet (25 mg total) by mouth 3 (three) times daily as needed for dizziness., Disp: 30 tablet, Rfl: 0   mometasone (NASONEX) 50 MCG/ACT nasal spray, Place 2 sprays into the nose as needed.  , Disp: , Rfl:    pantoprazole (PROTONIX) 40 MG tablet, Take 40 mg by mouth 2 (two) times daily., Disp: , Rfl:    rosuvastatin (CRESTOR) 40 MG tablet, Take by mouth., Disp: , Rfl:    SUMAtriptan (IMITREX) 50 MG tablet, Take 50 mg by mouth as needed.  , Disp: , Rfl:    valACYclovir (VALTREX) 1000 MG tablet, Take 1,000 mg by mouth daily., Disp: , Rfl:    venlafaxine (EFFEXOR) 75 MG tablet, Take 75 mg by mouth 2 (two) times daily with a meal., Disp: , Rfl:  bisacodyl (DULCOLAX) 5 MG EC tablet, Take 1 to 2 every 3 days as needed, Disp: , Rfl:    dicyclomine (BENTYL) 10 MG capsule, Take 1 capsule (10 mg total) by mouth 4 (four) times daily -  before meals and at bedtime for 30 doses., Disp: 30 capsule, Rfl: 0   metroNIDAZOLE (FLAGYL) 500 MG tablet, Take 1 tablet (500 mg total) by mouth 2 (two) times daily for 14 days., Disp: 28 tablet, Rfl: 0    Family History  Problem Relation Age of Onset   Heart attack Father 43   Hypertension Father    Stroke Father    Coronary artery disease Other    Other Mother        accident     Social History   Tobacco Use   Smoking status: Never Smoker   Smokeless tobacco: Never Used   Tobacco comment: tobacco use- no   Substance Use Topics   Alcohol use: Yes    Alcohol/week: 7.0 standard drinks    Types: 7 Cans of beer per week   Drug use: No    Allergies as of 03/14/2019 - Review Complete 03/14/2019  Allergen Reaction Noted   Penicillins  02/16/2010   Rofecoxib Other (See Comments) 04/27/2017    Review of Systems:    All systems reviewed and negative except  where noted in HPI.   Physical Exam:  BP 111/70    Pulse 87    Temp 98.9 F (37.2 C) (Temporal)    Ht 5' 5.5" (1.664 m)    Wt 124 lb (56.2 kg)    BMI 20.32 kg/m  No LMP recorded. Patient has had a hysterectomy.  General:   Alert,  Well-developed, well-nourished, pleasant and cooperative in NAD Head:  Normocephalic and atraumatic. Eyes:  Sclera clear, no icterus.   Conjunctiva pink. Ears:  Normal auditory acuity. Nose:  No deformity, discharge, or lesions. Mouth:  No deformity or lesions,oropharynx pink & moist. Neck:  Supple; no masses or thyromegaly. Lungs:  Respirations even and unlabored.  Clear throughout to auscultation.   No wheezes, crackles, or rhonchi. No acute distress. Heart:  Regular rate and rhythm; no murmurs, clicks, rubs, or gallops. Abdomen:  Normal bowel sounds. Soft, non-tender and mildly distended, tympanic to percussion without masses, hepatosplenomegaly or hernias noted.  No guarding or rebound tenderness.   Rectal: Not performed Msk:  Symmetrical without gross deformities. Good, equal movement & strength bilaterally. Pulses:  Normal pulses noted. Extremities:  No clubbing or edema.  No cyanosis. Neurologic:  Alert and oriented x3;  grossly normal neurologically. Skin:  Intact without significant lesions or rashes. No jaundice. Psych:  Alert and cooperative. Normal mood and affect.  Imaging Studies: Reviewed  Assessment and Plan:   Angela Ayala is a 73 y.o. female with no significant past medical history seen for follow-up of irritable bowel syndrome.  Her symptoms are suggestive of mixed type.  Work-up has been negative so far.  She does not have other alarm signs or symptoms  Continue Amitiza 24 MCG 1-2 times daily Trial of metronidazole 500 mg 2 times daily for 2 weeks for abdominal bloating Discussed with her about maintaining food diary, avoid gas producing foods Counseled her about further management of IBS, set her expectations about alleviating IBS  symptoms Continue to follow-up with psychologist for management of anxiety   Follow up in 2 months   Cephas Darby, MD

## 2019-04-17 ENCOUNTER — Telehealth: Payer: Self-pay | Admitting: Gastroenterology

## 2019-04-17 NOTE — Telephone Encounter (Signed)
Pt is calling she needs refill on rx Amitiza 24 mg to [facility] in [place] please call pt  After it has been called in

## 2019-04-18 MED ORDER — LUBIPROSTONE 24 MCG PO CAPS: 24.0000 ug | ORAL_CAPSULE | Freq: Two times a day (BID) | ORAL | 0 refills | Status: DC

## 2019-04-18 NOTE — Telephone Encounter (Signed)
Medication has been sent to pharmacy and pt has been notified  

## 2019-05-16 ENCOUNTER — Ambulatory Visit (INDEPENDENT_AMBULATORY_CARE_PROVIDER_SITE_OTHER): Payer: Medicare Other | Admitting: Gastroenterology

## 2019-05-16 ENCOUNTER — Other Ambulatory Visit: Payer: Self-pay

## 2019-05-16 ENCOUNTER — Encounter: Payer: Self-pay | Admitting: Gastroenterology

## 2019-05-16 VITALS — BP 114/72 | HR 76 | Temp 97.9°F | Resp 17 | Ht 65.5 in | Wt 121.2 lb

## 2019-05-16 DIAGNOSIS — K581 Irritable bowel syndrome with constipation: Secondary | ICD-10-CM

## 2019-05-16 MED ORDER — LUBIPROSTONE 24 MCG PO CAPS: 24.0000 ug | ORAL_CAPSULE | Freq: Two times a day (BID) | ORAL | 1 refills | Status: AC

## 2019-05-16 NOTE — Progress Notes (Signed)
Angela Darby, MD [address]  [place]  [place], [place] [address]  Main: [phone]  Fax: [phone]    Gastroenterology Consultation  Referring Provider:     Clarisse Gouge, MD Primary Care Physician:  Clarisse Gouge, MD Primary Gastroenterologist:  Dr. Cephas Ayala Reason for Consultation:     Irritable bowel syndrome        HPI:   Angela Ayala is a 73 y.o. female referred by Dr. Vickki Muff, Frederic Jericho, MD  for consultation & management of irritable bowel syndrome.  She has been experiencing several years history of abdominal bloating, generalized abdominal cramps and postprandial urgency with nonbloody soft stools.  She has seen 3 different gastroenterologists prior to this visit, at [facility] [place] [facility].  She was told that she has constipation with overflow diarrhea.  She underwent anorectal manometry at [facility] which was unremarkable for pelvic floor dyssynergia.  She tried antibiotics in the past cannot recollect the name.  She was also suggested low FODMAPs diet.  Patient underwent EGD and colonoscopy which were unremarkable.  Celiac serologies were negative.  TSH normal.  Her weight has been stable.  She had CT abdomen as well as x-ray KUB in 2018 and was found to have significant stool burden.  She has been treated as such with MiraLAX, Linzess, Amitiza, Metamucil which were not effective.  She said Linzess and Amitiza did not provide any benefit, caused severe cramps.  The only medication that helped for her is Dulcolax that has resulted in complete emptying and complete relief of her symptoms for about 2 to 3 days before the cycle starts again.  She is trying to eliminate roughage foods.  She does not smoke, drinks alcohol occasionally.  Does not drink sodas, does not chew gum.  She takes Protonix in the morning, famotidine at bedtime.  She is concerned that the symptoms are significantly impacting her daily life, unable to go out or perform her daily activities.   Her most concerning symptoms are abdominal cramps and postprandial bowel frequency.  She denies any stress in her life, lives with her husband  Follow-up visit 03/14/2019 Patient is here for her follow-up of constipation predominant IBS.  She reported that the Trulance samples helped.  However, she could not afford prescription.  She is currently on Amitiza 24 MCG twice daily and reports having loose bowel movements associated with mucus.  Her main concern is significant abdominal bloating associated with low back pain.  She notices that her GI symptoms occur when she has to attend a meeting or going out.  She also stopped going to labs as they were more stressful.  She gets distracted and her GI symptoms disappear when she watches TV, reading books or knitting.  She is currently seeing a psychologist for management of anxiety, distractibility.  Follow-up visit 05/16/2019 She reports that she was doing very well until about a week ago when she had worsening of constipation.  She was taking Amitiza 24 MCG twice daily.  When she ran out of prescription, sent in a prescription for 8 MCG twice daily.  She did not realize what dose she is taking.  She had to take Fleet enema in order to have a bowel movement.  She tried 2 weeks course of metronidazole for abdominal bloating which helped.  She seemed to be pleased with her overall wellbeing  NSAIDs: None  Antiplts/Anticoagulants/Anti thrombotics: None  GI Procedures:   EGD 07/12/2012 Diagnosis:  GASTROESOPHAGEAL JUNCTION  BIOPSIES:  - SQUAMOCOLUMNAR-JUNCTION MUCOSA WITH MILD CHRONIC ACTIVE  INFLAMMATION.  - NO INTESTINAL METAPLASIA SEEN IN THIS SAMPLE.  EGD and colonoscopy by Dr. Vira Agar 03/2016  - Erythema at the gastroesophageal junction and in the cardia. Biopsied. - Gastritis. Biopsied. - Normal examined duodenum.  - One small polyp in the ascending colon, removed with a hot snare. Resected and retrieved. - Internal hemorrhoids. - Foreign  body in the ascending colon. Removal was successful.  DIAGNOSIS:  A. STOMACH, ANTRUM AND BODY; COLD BIOPSY:  - ANTRAL AND OXYNTIC MUCOSA WITH FOCAL MILD REACTIVE FOVEOLAR  HYPERPLASIA.  - NEGATIVE FOR H. PYLORI, DYSPLASIA, AND MALIGNANCY.   B. GEJ; COLD BIOPSY:  - GASTRIC CARDIAC TYPE MUCOSA WITH CHANGES COMPATIBLE WITH REFLUX.  - SQUAMOUS MUCOSA IS NOT PRESENT FOR EVALUATION.  - NEGATIVE FOR DYSPLASIA AND MALIGNANCY.   C. COLON POLYP, CECUM; COLD SNARE AND COLD BIOPSY:  - TUBULAR ADENOMA.  - NEGATIVE FOR HIGH GRADE DYSPLASIAAND MALIGNANCY.   D. COLON POLYP, ASCENDING; HOT SNARE:  - TUBULAR ADENOMA.  - SESSILE SERRATED ADENOMA.  - NEGATIVE FOR HIGH GRADE DYSPLASIA AND MALIGNANCY.    Past Medical History:  Diagnosis Date   Arthritis    Depression    DJD (degenerative joint disease)    GERD (gastroesophageal reflux disease)    Headache    Migraines   Hepatic hemangioma    HLD (hyperlipidemia)    Hyperlipidemia    Osteoporosis    Rectocele    Urethral prolapse     Past Surgical History:  Procedure Laterality Date   ABDOMINAL HYSTERECTOMY     BREAST REDUCTION SURGERY     COLONOSCOPY WITH PROPOFOL N/A 04/12/2016   Procedure: COLONOSCOPY WITH PROPOFOL;  Surgeon: Manya Silvas, MD;  Location: [facility] ENDOSCOPY;  Service: Endoscopy;  Laterality: N/A;   ESOPHAGOGASTRODUODENOSCOPY (EGD) WITH PROPOFOL N/A 04/12/2016   Procedure: ESOPHAGOGASTRODUODENOSCOPY (EGD) WITH PROPOFOL;  Surgeon: Manya Silvas, MD;  Location: [facility] ENDOSCOPY;  Service: Endoscopy;  Laterality: N/A;   fusion of lumbar     L4/5   RECTOCELE REPAIR       Current Outpatient Medications:    carbamazepine (CARBATROL) 100 MG 12 hr capsule, Take 100 mg by mouth 2 (two) times daily., Disp: , Rfl:    conjugated estrogens (PREMARIN) vaginal cream, Apply topically., Disp: , Rfl:    cyclobenzaprine (FLEXERIL) 10 MG tablet, Take 10 mg by mouth 3 (three) times daily as needed. for muscle spams,  Disp: , Rfl:    Estradiol 10 MCG TABS vaginal tablet, Insert 1 tablet vaginally 2 times a week, Disp: , Rfl:    gabapentin (NEURONTIN) 600 MG tablet, Take 600 mg by mouth 4 (four) times daily as needed., Disp: , Rfl:    meclizine (ANTIVERT) 25 MG tablet, Take 1 tablet (25 mg total) by mouth 3 (three) times daily as needed for dizziness., Disp: 30 tablet, Rfl: 0   mometasone (NASONEX) 50 MCG/ACT nasal spray, Place 2 sprays into the nose as needed.  , Disp: , Rfl:    pantoprazole (PROTONIX) 40 MG tablet, Take 40 mg by mouth 2 (two) times daily., Disp: , Rfl:    rosuvastatin (CRESTOR) 40 MG tablet, Take by mouth., Disp: , Rfl:    SUMAtriptan (IMITREX) 50 MG tablet, Take 50 mg by mouth as needed.  , Disp: , Rfl:    valACYclovir (VALTREX) 1000 MG tablet, Take 1,000 mg by mouth daily., Disp: , Rfl:    venlafaxine (EFFEXOR) 75 MG tablet, Take 75 mg by mouth  2 (two) times daily with a meal., Disp: , Rfl:    aspirin (ASPIR-81) 81 MG EC tablet, Take 81 mg by mouth at bedtime.  , Disp: , Rfl:    lubiprostone (AMITIZA) 24 MCG capsule, Take 1 capsule (24 mcg total) by mouth 2 (two) times daily with a meal., Disp: 180 capsule, Rfl: 1    Family History  Problem Relation Age of Onset   Heart attack Father 37   Hypertension Father    Stroke Father    Coronary artery disease Other    Other Mother        accident     Social History   Tobacco Use   Smoking status: Never Smoker   Smokeless tobacco: Never Used   Tobacco comment: tobacco use- no   Substance Use Topics   Alcohol use: Yes    Alcohol/week: 7.0 standard drinks    Types: 7 Cans of beer per week   Drug use: No    Allergies as of 05/16/2019 - Review Complete 05/16/2019  Allergen Reaction Noted   Penicillins  02/16/2010   Rofecoxib Other (See Comments) 04/27/2017    Review of Systems:    All systems reviewed and negative except where noted in HPI.   Physical Exam:  BP 114/72 (BP Location: Left Arm, Patient  Position: Sitting, Cuff Size: Normal)    Pulse 76    Temp 97.9 F (36.6 C)    Resp 17    Ht 5' 5.5" (1.664 m)    Wt 121 lb 3.2 oz (55 kg)    BMI 19.86 kg/m  No LMP recorded. Patient has had a hysterectomy.  General:   Alert,  Well-developed, well-nourished, pleasant and cooperative in NAD Head:  Normocephalic and atraumatic. Eyes:  Sclera clear, no icterus.   Conjunctiva pink. Ears:  Normal auditory acuity. Nose:  No deformity, discharge, or lesions. Mouth:  No deformity or lesions,oropharynx pink & moist. Neck:  Supple; no masses or thyromegaly. Lungs:  Respirations even and unlabored.  Clear throughout to auscultation.   No wheezes, crackles, or rhonchi. No acute distress. Heart:  Regular rate and rhythm; no murmurs, clicks, rubs, or gallops. Abdomen:  Normal bowel sounds. Soft, non-tender, nondistended without masses, hepatosplenomegaly or hernias noted.  No guarding or rebound tenderness.   Rectal: Not performed Msk:  Symmetrical without gross deformities. Good, equal movement & strength bilaterally. Pulses:  Normal pulses noted. Extremities:  No clubbing or edema.  No cyanosis. Neurologic:  Alert and oriented x3;  grossly normal neurologically. Skin:  Intact without significant lesions or rashes. No jaundice. Psych:  Alert and cooperative. Normal mood and affect.  Imaging Studies: Reviewed  Assessment and Plan:   Angela Ayala is a 73 y.o. female with no significant past medical history seen for follow-up of irritable bowel syndrome.  Her symptoms are suggestive of mixed type.  Work-up has been negative so far.  She does not have other alarm signs or symptoms  Continue Amitiza 24 MCG 1-2 times daily Continue to follow-up with psychologist for management of anxiety   Follow up in 3-4 months   Angela Darby, MD

## 2019-08-07 ENCOUNTER — Telehealth: Payer: Self-pay

## 2019-08-07 NOTE — Telephone Encounter (Signed)
Patient is calling because she has been taking the Amitiza and for the last 3 weeks she has had extreme constipation. Patient states that she will go several days with out a bowel movement and then finally will be able to go very little. She will have to sit on the toilet and stain and move around when she able to go a little. Patient states she did late last week do a fleet enema and that was effective but now is constipated. Patient states from being constipated her hemorrhoids are flared up.   CB [phone]

## 2019-08-07 NOTE — Telephone Encounter (Signed)
Called and left and left a message for call back

## 2019-08-07 NOTE — Telephone Encounter (Signed)
Please confirm with her if she is taking Amitiza 24 MCG 2 times a day.  This was the dose that was actually working for her.  If she is taking this dose and still constipated then we can try Linzess 290 MCG once a day.  She can pick up samples and she has to stop Amitiza when she takes Linzess  RV

## 2019-08-08 NOTE — Telephone Encounter (Signed)
Called and left a message for call back  

## 2019-08-14 ENCOUNTER — Telehealth: Payer: Self-pay

## 2019-08-14 NOTE — Telephone Encounter (Signed)
Pt returned call regarding her constipation symptoms. Advised pt of Dr. Verlin Grills recommendation to start Linzess 283mcg if Amitiza 44mcg was not working. Pt will stop by the office and pick up samples tomorrow.

## 2019-08-28 ENCOUNTER — Telehealth: Payer: Self-pay | Admitting: Gastroenterology

## 2019-08-28 NOTE — Telephone Encounter (Signed)
Called and left a message for call back  

## 2019-08-28 NOTE — Telephone Encounter (Signed)
cALLED AND LEFT A MESSAGE FOR CALLL BACK

## 2019-08-28 NOTE — Telephone Encounter (Signed)
Pt left vm she has a question regarding rx Linzess being compared to rx Amitiza  She only has 3  Pills left on rx Amitiza and wants to  discuss Linzess

## 2019-08-29 ENCOUNTER — Telehealth: Payer: Self-pay | Admitting: Gastroenterology

## 2019-08-29 NOTE — Telephone Encounter (Signed)
Called and left a message for call back  

## 2019-08-29 NOTE — Telephone Encounter (Signed)
Pt left vm she has a question regrading her medications please call pt

## 2019-08-31 ENCOUNTER — Telehealth: Payer: Self-pay | Admitting: Gastroenterology

## 2019-08-31 NOTE — Telephone Encounter (Signed)
Called and left a message for call back. I have call 08/28/2019 two times 08/29/2019 and today

## 2019-08-31 NOTE — Telephone Encounter (Signed)
Pt left vm she states she has been calling us with no response  Regarding a medication question

## 2019-08-31 NOTE — Telephone Encounter (Signed)
Tried to call patient and left a message for call back  ?

## 2019-09-03 ENCOUNTER — Telehealth: Payer: Self-pay

## 2019-09-03 NOTE — Telephone Encounter (Signed)
Tried to call patient went straight to voicemail and left a message for call back

## 2019-09-03 NOTE — Telephone Encounter (Signed)
Patient called and left a voicemail on my phone because she states she needs to talk to Dr. Marius Ditch nurse and has been trying for 1 week. Each time she calls I call her back and either goes to voicemail or will ring once and go to voicemail.

## 2019-09-05 ENCOUNTER — Telehealth: Payer: Self-pay

## 2019-09-05 NOTE — Telephone Encounter (Signed)
Called and left a message for call back  

## 2019-09-05 NOTE — Telephone Encounter (Signed)
Patient is calling because she states she has been using the samples of the Linzess 290. Patient states that she still had constipation on medication. She would go 3-4 days with out a bowel movement. Patient states when she his 4 days she would use miralax to go to the bathroom. Patient wants to know if she should get a prescription for the linzess or what she should do.

## 2019-09-05 NOTE — Telephone Encounter (Signed)
Did referral for [facility]

## 2019-09-05 NOTE — Telephone Encounter (Signed)
She can try Linzess 290 MCG two times a day for about a week and see if it helps.  Next option is a trial of Trulance once daily.  Please refer her to [facility] for Anorectal manometry, [id number] constipation refractory to medication  RV

## 2019-09-06 NOTE — Telephone Encounter (Signed)
Called and left a message for call back  

## 2019-09-06 NOTE — Telephone Encounter (Signed)
Tried to call patient and number is going straight to voicemail still. Sent mychart message

## 2019-09-10 ENCOUNTER — Telehealth: Payer: Self-pay | Admitting: Gastroenterology

## 2019-09-10 ENCOUNTER — Telehealth: Payer: Self-pay

## 2019-09-10 NOTE — Telephone Encounter (Signed)
Pt left vm for Ashley returning her call °

## 2019-09-10 NOTE — Telephone Encounter (Signed)
Pt left vm to reach [place]

## 2019-09-10 NOTE — Telephone Encounter (Signed)
Called and left a message for call back  

## 2019-09-10 NOTE — Telephone Encounter (Signed)
Patient called back and left a message for call back on my voicemail. Patient stated on voicemail to call her back at [phone]. Called patient on this number went straight to voicemail. Left a message for call back

## 2019-09-10 NOTE — Telephone Encounter (Signed)
Patient voicemail went straight to voicemail. Left a message for call back

## 2019-09-14 ENCOUNTER — Other Ambulatory Visit: Payer: Self-pay

## 2019-09-19 ENCOUNTER — Encounter: Payer: Self-pay | Admitting: Gastroenterology

## 2019-09-19 ENCOUNTER — Ambulatory Visit (INDEPENDENT_AMBULATORY_CARE_PROVIDER_SITE_OTHER): Payer: Medicare PPO | Admitting: Gastroenterology

## 2019-09-19 ENCOUNTER — Other Ambulatory Visit: Payer: Self-pay

## 2019-09-19 VITALS — BP 119/75 | HR 86 | Temp 97.8°F | Wt 126.5 lb

## 2019-09-19 DIAGNOSIS — K581 Irritable bowel syndrome with constipation: Secondary | ICD-10-CM

## 2019-09-19 NOTE — Progress Notes (Signed)
Cephas Darby, MD [address]  [place]  [place], [place] [address]  Main: [phone]  Fax: [phone]    Gastroenterology Consultation  Referring Provider:     Clarisse Gouge, MD Primary Care Physician:  Dereck Ligas, DO Primary Gastroenterologist:  Dr. Cephas Darby Reason for Consultation:     Irritable bowel syndrome        HPI:   Angela Ayala is a 74 y.o. female referred by Dr. Dereck Ligas, DO  for consultation & management of irritable bowel syndrome.  She has been experiencing several years history of abdominal bloating, generalized abdominal cramps and postprandial urgency with nonbloody soft stools.  She has seen 3 different gastroenterologists prior to this visit, at [facility] [place] [facility].  She was told that she has constipation with overflow diarrhea.  She underwent anorectal manometry at [facility] which was unremarkable for pelvic floor dyssynergia.  She tried antibiotics in the past cannot recollect the name.  She was also suggested low FODMAPs diet.  Patient underwent EGD and colonoscopy which were unremarkable.  Celiac serologies were negative.  TSH normal.  Her weight has been stable.  She had CT abdomen as well as x-ray KUB in 2018 and was found to have significant stool burden.  She has been treated as such with MiraLAX, Linzess, Amitiza, Metamucil which were not effective.  She said Linzess and Amitiza did not provide any benefit, caused severe cramps.  The only medication that helped for her is Dulcolax that has resulted in complete emptying and complete relief of her symptoms for about 2 to 3 days before the cycle starts again.  She is trying to eliminate roughage foods.  She does not smoke, drinks alcohol occasionally.  Does not drink sodas, does not chew gum.  She takes Protonix in the morning, famotidine at bedtime.  She is concerned that the symptoms are significantly impacting her daily life, unable to go out or perform her daily activities.  Her  most concerning symptoms are abdominal cramps and postprandial bowel frequency.  She denies any stress in her life, lives with her husband  Follow-up visit 03/14/2019 Patient is here for her follow-up of constipation predominant IBS.  She reported that the Trulance samples helped.  However, she could not afford prescription.  She is currently on Amitiza 24 MCG twice daily and reports having loose bowel movements associated with mucus.  Her main concern is significant abdominal bloating associated with low back pain.  She notices that her GI symptoms occur when she has to attend a meeting or going out.  She also stopped going to labs as they were more stressful.  She gets distracted and her GI symptoms disappear when she watches TV, reading books or knitting.  She is currently seeing a psychologist for management of anxiety, distractibility.  Follow-up visit 05/16/2019 She reports that she was doing very well until about a week ago when she had worsening of constipation.  She was taking Amitiza 24 MCG twice daily.  When she ran out of prescription, sent in a prescription for 8 MCG twice daily.  She did not realize what dose she is taking.  She had to take Fleet enema in order to have a bowel movement.  She tried 2 weeks course of metronidazole for abdominal bloating which helped.  She seemed to be pleased with her overall wellbeing  Follow-up visit 09/19/2019 Patient continues to have ongoing constipation and abdominal bloating.  She tried Linzess 290 MCG daily  which helped for 2 to 3 weeks, then the effect wore off. She did not realize that lubiprostone is generic form of Amitiza.  She wants to try magnesium powder.  She states she did not hear back from [facility] regarding anorectal manometry referral.  She does not have any other complaints today.  NSAIDs: None  Antiplts/Anticoagulants/Anti thrombotics: None  GI Procedures:   EGD 07/12/2012 Diagnosis:  GASTROESOPHAGEAL JUNCTION BIOPSIES:  -  SQUAMOCOLUMNAR-JUNCTION MUCOSA WITH MILD CHRONIC ACTIVE  INFLAMMATION.  - NO INTESTINAL METAPLASIA SEEN IN THIS SAMPLE.  EGD and colonoscopy by Dr. Vira Agar 03/2016  - Erythema at the gastroesophageal junction and in the cardia. Biopsied. - Gastritis. Biopsied. - Normal examined duodenum.  - One small polyp in the ascending colon, removed with a hot snare. Resected and retrieved. - Internal hemorrhoids. - Foreign body in the ascending colon. Removal was successful.  DIAGNOSIS:  A. STOMACH, ANTRUM AND BODY; COLD BIOPSY:  - ANTRAL AND OXYNTIC MUCOSA WITH FOCAL MILD REACTIVE FOVEOLAR  HYPERPLASIA.  - NEGATIVE FOR H. PYLORI, DYSPLASIA, AND MALIGNANCY.   B. GEJ; COLD BIOPSY:  - GASTRIC CARDIAC TYPE MUCOSA WITH CHANGES COMPATIBLE WITH REFLUX.  - SQUAMOUS MUCOSA IS NOT PRESENT FOR EVALUATION.  - NEGATIVE FOR DYSPLASIA AND MALIGNANCY.   C. COLON POLYP, CECUM; COLD SNARE AND COLD BIOPSY:  - TUBULAR ADENOMA.  - NEGATIVE FOR HIGH GRADE DYSPLASIAAND MALIGNANCY.   D. COLON POLYP, ASCENDING; HOT SNARE:  - TUBULAR ADENOMA.  - SESSILE SERRATED ADENOMA.  - NEGATIVE FOR HIGH GRADE DYSPLASIA AND MALIGNANCY.    Past Medical History:  Diagnosis Date  . Arthritis   . Depression   . DJD (degenerative joint disease)   . GERD (gastroesophageal reflux disease)   . Headache    Migraines  . Hepatic hemangioma   . HLD (hyperlipidemia)   . Hyperlipidemia   . Osteoporosis   . Rectocele   . Urethral prolapse     Past Surgical History:  Procedure Laterality Date  . ABDOMINAL HYSTERECTOMY    . BREAST REDUCTION SURGERY    . COLONOSCOPY WITH PROPOFOL N/A 04/12/2016   Procedure: COLONOSCOPY WITH PROPOFOL;  Surgeon: Manya Silvas, MD;  Location[facility] ENDOSCOPY;  Service: Endoscopy;  Laterality: N/A;  . ESOPHAGOGASTRODUODENOSCOPY (EGD) WITH PROPOFOL N/A 04/12/2016   Procedure: ESOPHAGOGASTRODUODENOSCOPY (EGD) WITH PROPOFOL;  Surgeon: Manya Silvas, MD;  Location: [facility] ENDOSCOPY;  Service:  Endoscopy;  Laterality: N/A;  . fusion of lumbar     L4/5  . RECTOCELE REPAIR       Current Outpatient Medications:  .  aspirin (ASPIR-81) 81 MG EC tablet, Take 81 mg by mouth at bedtime.  , Disp: , Rfl:  .  carbamazepine (CARBATROL) 100 MG 12 hr capsule, Take 100 mg by mouth 2 (two) times daily., Disp: , Rfl:  .  Cholecalciferol 50 MCG (2000 UT) CAPS, Take by mouth., Disp: , Rfl:  .  Estradiol 10 MCG TABS vaginal tablet, Insert 1 tablet vaginally 2 times a week, Disp: , Rfl:  .  gabapentin (NEURONTIN) 600 MG tablet, Take 600 mg by mouth 4 (four) times daily as needed., Disp: , Rfl:  .  linaclotide (LINZESS) 290 MCG CAPS capsule, Take 290 mcg by mouth daily before breakfast., Disp: , Rfl:  .  meclizine (ANTIVERT) 25 MG tablet, Take 1 tablet (25 mg total) by mouth 3 (three) times daily as needed for dizziness., Disp: 30 tablet, Rfl: 0 .  mometasone (NASONEX) 50 MCG/ACT nasal spray, Place 2 sprays into the nose as needed.  ,  Disp: , Rfl:  .  pantoprazole (PROTONIX) 40 MG tablet, Take 40 mg by mouth 2 (two) times daily., Disp: , Rfl:  .  rosuvastatin (CRESTOR) 40 MG tablet, Take by mouth., Disp: , Rfl:  .  SUMAtriptan (IMITREX) 50 MG tablet, Take 50 mg by mouth as needed.  , Disp: , Rfl:  .  valACYclovir (VALTREX) 1000 MG tablet, Take 1,000 mg by mouth daily., Disp: , Rfl:  .  venlafaxine (EFFEXOR) 75 MG tablet, Take 75 mg by mouth 2 (two) times daily with a meal., Disp: , Rfl:  .  conjugated estrogens (PREMARIN) vaginal cream, Apply topically., Disp: , Rfl:  .  cyclobenzaprine (FLEXERIL) 10 MG tablet, Take 10 mg by mouth 3 (three) times daily as needed. for muscle spams, Disp: , Rfl:  .  lubiprostone (AMITIZA) 24 MCG capsule, , Disp: , Rfl:     Family History  Problem Relation Age of Onset  . Heart attack Father 57  . Hypertension Father   . Stroke Father   . Coronary artery disease Other   . Other Mother        accident     Social History   Tobacco Use  . Smoking status: Never  Smoker  . Smokeless tobacco: Never Used  . Tobacco comment: tobacco use- no   Substance Use Topics  . Alcohol use: Yes    Alcohol/week: 7.0 standard drinks    Types: 7 Cans of beer per week  . Drug use: No    Allergies as of 09/19/2019 - Review Complete 09/19/2019  Allergen Reaction Noted  . Penicillins  02/16/2010  . Rofecoxib Other (See Comments) 04/27/2017    Review of Systems:    All systems reviewed and negative except where noted in HPI.   Physical Exam:  BP 119/75 (BP Location: Left Arm, Patient Position: Sitting, Cuff Size: Normal)   Pulse 86   Temp 97.8 F (36.6 C) (Oral)   Wt 126 lb 8 oz (57.4 kg)   BMI 20.73 kg/m  No LMP recorded. Patient has had a hysterectomy.  General:   Alert,  Well-developed, well-nourished, pleasant and cooperative in NAD Head:  Normocephalic and atraumatic. Eyes:  Sclera clear, no icterus.   Conjunctiva pink. Ears:  Normal auditory acuity. Nose:  No deformity, discharge, or lesions. Mouth:  No deformity or lesions,oropharynx pink & moist. Neck:  Supple; no masses or thyromegaly. Lungs:  Respirations even and unlabored.  Clear throughout to auscultation.   No wheezes, crackles, or rhonchi. No acute distress. Heart:  Regular rate and rhythm; no murmurs, clicks, rubs, or gallops. Abdomen:  Normal bowel sounds. Soft, non-tender, nondistended without masses, hepatosplenomegaly or hernias noted.  No guarding or rebound tenderness.   Rectal: Not performed Msk:  Symmetrical without gross deformities. Good, equal movement & strength bilaterally. Pulses:  Normal pulses noted. Extremities:  No clubbing or edema.  No cyanosis. Neurologic:  Alert and oriented x3;  grossly normal neurologically. Skin:  Intact without significant lesions or rashes. No jaundice. Psych:  Alert and cooperative. Normal mood and affect.  Imaging Studies: Reviewed  Assessment and Plan:   Angela Ayala is a 74 y.o. female with no significant past medical history seen  for follow-up of irritable bowel syndrome constipation predominant. Work-up has been negative so far.  She does not have other alarm signs or symptoms  Suggested her to try kiwi fruit twice daily Continue lubiprostone 24 MCG 2 times a day We will follow up on anorectal manometry referral to [facility]  Recommend surveillance colonoscopy in 2022   Follow up in 3-4 months   Cephas Darby, MD

## 2019-09-20 NOTE — Telephone Encounter (Signed)
Tried to call patient but voicemail is full. Tried to call emergency contact left a message for call back

## 2019-09-28 ENCOUNTER — Encounter: Payer: Self-pay | Admitting: Gastroenterology

## 2019-09-28 ENCOUNTER — Telehealth: Payer: Self-pay | Admitting: Gastroenterology

## 2019-09-28 NOTE — Telephone Encounter (Signed)
Patient was having problems with her MyChart. We resolved her problem. I shared Angela Ayala's message on 09/19/19. Also, let Angela Ayala know that she might be having problems with her phone as well as her voicemail being full.

## 2019-10-11 ENCOUNTER — Telehealth: Payer: Self-pay

## 2019-10-11 ENCOUNTER — Telehealth: Payer: Self-pay | Admitting: Gastroenterology

## 2019-10-11 DIAGNOSIS — M6289 Other specified disorders of muscle: Secondary | ICD-10-CM

## 2019-10-11 NOTE — Telephone Encounter (Signed)
The results are in care everywhere.

## 2019-10-11 NOTE — Telephone Encounter (Signed)
Documented in other note

## 2019-10-11 NOTE — Telephone Encounter (Signed)
Patient would like to be advised on the results of her 09/25/19 Manometry Results performed at [facility].  She also wanted to know why she could not view them in her mychart-I explained to her that since it was done at [facility] it is viewable to [place] but not available in her [facility] unless she incorporated both [facility] and [place] to the same [place].  To alleviate that I advised that after you or Caryl Pina have discussed her results with her-we will send her a copy of the actual results for her records.  Please advise on results.  Thanks,  [place], [place]

## 2019-10-11 NOTE — Telephone Encounter (Signed)
LVM wanting her test results.

## 2019-10-12 NOTE — Telephone Encounter (Signed)
Attempted to call patient, unable to leave voicemail.  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  She will need to be referred to a physical therapist who does biofeedback at [facility]

## 2019-10-15 NOTE — Addendum Note (Signed)
Addended by: Ulyess Blossom L on: 10/15/2019 10:26 AM   Modules accepted: Orders

## 2019-10-15 NOTE — Telephone Encounter (Signed)
Tried to call patient but voicemail is full unable to leave a message  

## 2019-10-15 NOTE — Telephone Encounter (Signed)
Called patient and patient verbalized understanding. Patient states she went to physical therapy before and they discharge her because she said she was getting better. She states she will try it again

## 2019-11-15 ENCOUNTER — Other Ambulatory Visit: Payer: Self-pay

## 2019-11-15 MED ORDER — LUBIPROSTONE 24 MCG PO CAPS: 24.0000 ug | ORAL_CAPSULE | Freq: Every day | ORAL | 0 refills | Status: DC

## 2019-11-19 ENCOUNTER — Telehealth: Payer: Self-pay

## 2019-11-19 NOTE — Telephone Encounter (Signed)
Patient insurance is not covering the Amitiza 71mcg. Patient insurance will cover the Linzess 145. Please advised

## 2019-11-19 NOTE — Telephone Encounter (Signed)
Ok, fine with me  RV

## 2019-11-19 NOTE — Telephone Encounter (Signed)
Patient has already tried to take linzess Did PA on Amitiza 60mcg

## 2019-11-20 ENCOUNTER — Encounter: Payer: Self-pay | Admitting: Gastroenterology

## 2019-11-20 MED ORDER — LUBIPROSTONE 24 MCG PO CAPS: 24.0000 ug | ORAL_CAPSULE | Freq: Every day | ORAL | 1 refills | Status: DC

## 2019-11-27 ENCOUNTER — Ambulatory Visit: Payer: Medicare PPO | Admitting: Physical Therapy

## 2019-12-05 ENCOUNTER — Ambulatory Visit: Payer: Medicare PPO | Attending: Gastroenterology | Admitting: Physical Therapy

## 2019-12-05 ENCOUNTER — Ambulatory Visit: Payer: Medicare PPO | Admitting: Physical Therapy

## 2019-12-12 ENCOUNTER — Encounter: Payer: Medicare PPO | Admitting: Physical Therapy

## 2019-12-18 ENCOUNTER — Other Ambulatory Visit: Payer: Self-pay

## 2019-12-18 MED ORDER — LUBIPROSTONE 24 MCG PO CAPS: 24.0000 ug | ORAL_CAPSULE | Freq: Every day | ORAL | 1 refills | Status: DC

## 2019-12-18 NOTE — Telephone Encounter (Signed)
Last office visit 09/19/2019 IBS  Last refill 11/20/2019 1 refills

## 2019-12-19 ENCOUNTER — Encounter: Payer: Medicare PPO | Admitting: Physical Therapy

## 2019-12-26 ENCOUNTER — Encounter: Payer: Medicare PPO | Admitting: Physical Therapy

## 2020-01-02 ENCOUNTER — Encounter: Payer: Medicare PPO | Admitting: Physical Therapy

## 2020-01-09 ENCOUNTER — Encounter: Payer: Medicare PPO | Admitting: Physical Therapy

## 2020-01-16 ENCOUNTER — Encounter: Payer: Medicare PPO | Admitting: Physical Therapy

## 2020-01-23 ENCOUNTER — Encounter: Payer: Medicare PPO | Admitting: Physical Therapy

## 2020-01-30 ENCOUNTER — Encounter: Payer: Medicare PPO | Admitting: Physical Therapy

## 2020-02-03 ENCOUNTER — Encounter: Payer: Self-pay | Admitting: Gastroenterology

## 2020-02-14 ENCOUNTER — Other Ambulatory Visit: Payer: Self-pay

## 2020-02-18 ENCOUNTER — Other Ambulatory Visit: Payer: Self-pay

## 2020-02-18 MED ORDER — LUBIPROSTONE 24 MCG PO CAPS: 24.0000 ug | ORAL_CAPSULE | Freq: Two times a day (BID) | ORAL | 0 refills | Status: DC

## 2020-03-25 ENCOUNTER — Other Ambulatory Visit: Payer: Self-pay

## 2020-03-27 ENCOUNTER — Other Ambulatory Visit: Payer: Self-pay

## 2020-03-27 ENCOUNTER — Encounter: Payer: Self-pay | Admitting: Gastroenterology

## 2020-03-27 ENCOUNTER — Ambulatory Visit (INDEPENDENT_AMBULATORY_CARE_PROVIDER_SITE_OTHER): Payer: Medicare PPO | Admitting: Gastroenterology

## 2020-03-27 VITALS — BP 123/74 | HR 87 | Temp 97.4°F | Ht 60.0 in | Wt 123.8 lb

## 2020-03-27 DIAGNOSIS — M6289 Other specified disorders of muscle: Secondary | ICD-10-CM | POA: Diagnosis not present

## 2020-03-27 DIAGNOSIS — K581 Irritable bowel syndrome with constipation: Secondary | ICD-10-CM | POA: Diagnosis not present

## 2020-03-27 NOTE — Progress Notes (Signed)
Cephas Darby, MD [address]  [place]  [place], [place] [address]  Main: [phone]  Fax: [phone]    Gastroenterology Consultation  Referring Provider:     Dereck Ligas, DO Primary Care Physician:  Dereck Ligas, DO Primary Gastroenterologist:  Dr. Cephas Darby Reason for Consultation:     Irritable bowel syndrome, constipation        HPI:   Angela Ayala is a 74 y.o. female referred by Dr. Dereck Ligas, DO  for consultation & management of irritable bowel syndrome.  She has been experiencing several years history of abdominal bloating, generalized abdominal cramps and postprandial urgency with nonbloody soft stools.  She has seen 3 different gastroenterologists prior to this visit, at [facility], [facility].  She was told that she has constipation with overflow diarrhea.  She underwent anorectal manometry at [facility] which was unremarkable for pelvic floor dyssynergia.  She tried antibiotics in the past cannot recollect the name.  She was also suggested low FODMAPs diet.  Patient underwent EGD and colonoscopy which were unremarkable.  Celiac serologies were negative.  TSH normal.  Her weight has been stable.  She had CT abdomen as well as x-ray KUB in 2018 and was found to have significant stool burden.  She has been treated as such with MiraLAX, Linzess, Amitiza, Metamucil which were not effective.  She said Linzess and Amitiza did not provide any benefit, caused severe cramps.  The only medication that helped for her is Dulcolax that has resulted in complete emptying and complete relief of her symptoms for about 2 to 3 days before the cycle starts again.  She is trying to eliminate roughage foods.  She does not smoke, drinks alcohol occasionally.  Does not drink sodas, does not chew gum.  She takes Protonix in the morning, famotidine at bedtime.  She is concerned that the symptoms are significantly impacting her daily life, unable to go out or perform her daily  activities.  Her most concerning symptoms are abdominal cramps and postprandial bowel frequency.  She denies any stress in her life, lives with her husband  Follow-up visit 03/14/2019 Patient is here for her follow-up of constipation predominant IBS.  She reported that the Trulance samples helped.  However, she could not afford prescription.  She is currently on Amitiza 24 MCG twice daily and reports having loose bowel movements associated with mucus.  Her main concern is significant abdominal bloating associated with low back pain.  She notices that her GI symptoms occur when she has to attend a meeting or going out.  She also stopped going to labs as they were more stressful.  She gets distracted and her GI symptoms disappear when she watches TV, reading books or knitting.  She is currently seeing a psychologist for management of anxiety, distractibility.  Follow-up visit 05/16/2019 She reports that she was doing very well until about a week ago when she had worsening of constipation.  She was taking Amitiza 24 MCG twice daily.  When she ran out of prescription, sent in a prescription for 8 MCG twice daily.  She did not realize what dose she is taking.  She had to take Fleet enema in order to have a bowel movement.  She tried 2 weeks course of metronidazole for abdominal bloating which helped.  She seemed to be pleased with her overall wellbeing  Follow-up visit 09/19/2019 Patient continues to have ongoing constipation and abdominal bloating.  She tried Linzess 290 MCG daily  which helped for 2 to 3 weeks, then the effect wore off. She did not realize that lubiprostone is generic form of Amitiza.  She wants to try magnesium powder.  She states she did not hear back from [facility] regarding anorectal manometry referral.  She does not have any other complaints today.  Follow-up visit 03/27/2020 Patient is here for routine follow-up.  She has been taking Amitiza 24 MCG 1-2 times daily for constipation.  She tends  to forget the evening dose.  She does report mild abdominal bloating and mild discomfort only.  Unfortunately, her husband passed away from metastatic cancer recently.  She is now moving to [place] to live closer to her son.   NSAIDs: None  Antiplts/Anticoagulants/Anti thrombotics: None  GI Procedures:   EGD 07/12/2012 Diagnosis:  GASTROESOPHAGEAL JUNCTION BIOPSIES:  - SQUAMOCOLUMNAR-JUNCTION MUCOSA WITH MILD CHRONIC ACTIVE  INFLAMMATION.  - NO INTESTINAL METAPLASIA SEEN IN THIS SAMPLE.  EGD and colonoscopy by Dr. Vira Agar 03/2016  - Erythema at the gastroesophageal junction and in the cardia. Biopsied. - Gastritis. Biopsied. - Normal examined duodenum.  - One small polyp in the ascending colon, removed with a hot snare. Resected and retrieved. - Internal hemorrhoids. - Foreign body in the ascending colon. Removal was successful.  DIAGNOSIS:  A. STOMACH, ANTRUM AND BODY; COLD BIOPSY:  - ANTRAL AND OXYNTIC MUCOSA WITH FOCAL MILD REACTIVE FOVEOLAR  HYPERPLASIA.  - NEGATIVE FOR H. PYLORI, DYSPLASIA, AND MALIGNANCY.   B. GEJ; COLD BIOPSY:  - GASTRIC CARDIAC TYPE MUCOSA WITH CHANGES COMPATIBLE WITH REFLUX.  - SQUAMOUS MUCOSA IS NOT PRESENT FOR EVALUATION.  - NEGATIVE FOR DYSPLASIA AND MALIGNANCY.   C. COLON POLYP, CECUM; COLD SNARE AND COLD BIOPSY:  - TUBULAR ADENOMA.  - NEGATIVE FOR HIGH GRADE DYSPLASIAAND MALIGNANCY.   D. COLON POLYP, ASCENDING; HOT SNARE:  - TUBULAR ADENOMA.  - SESSILE SERRATED ADENOMA.  - NEGATIVE FOR HIGH GRADE DYSPLASIA AND MALIGNANCY.    Past Medical History:  Diagnosis Date  . Arthritis   . Depression   . DJD (degenerative joint disease)   . GERD (gastroesophageal reflux disease)   . Headache    Migraines  . Hepatic hemangioma   . HLD (hyperlipidemia)   . Hyperlipidemia   . Osteoporosis   . Rectocele   . Urethral prolapse     Past Surgical History:  Procedure Laterality Date  . ABDOMINAL HYSTERECTOMY    . BREAST REDUCTION SURGERY     . COLONOSCOPY WITH PROPOFOL N/A 04/12/2016   Procedure: COLONOSCOPY WITH PROPOFOL;  Surgeon: Manya Silvas, MD;  Location: [facility] ENDOSCOPY;  Service: Endoscopy;  Laterality: N/A;  . ESOPHAGOGASTRODUODENOSCOPY (EGD) WITH PROPOFOL N/A 04/12/2016   Procedure: ESOPHAGOGASTRODUODENOSCOPY (EGD) WITH PROPOFOL;  Surgeon: Manya Silvas, MD;  Location: [facility] ENDOSCOPY;  Service: Endoscopy;  Laterality: N/A;  . fusion of lumbar     L4/5  . RECTOCELE REPAIR       Current Outpatient Medications:  .  aspirin (ASPIR-81) 81 MG EC tablet, Take 81 mg by mouth at bedtime.  , Disp: , Rfl:  .  calcium carbonate (OS-CAL) 1250 (500 Ca) MG chewable tablet, Chew by mouth., Disp: , Rfl:  .  carbamazepine (CARBATROL) 100 MG 12 hr capsule, Take 100 mg by mouth 2 (two) times daily., Disp: , Rfl:  .  Cholecalciferol 50 MCG (2000 UT) CAPS, Take by mouth., Disp: , Rfl:  .  conjugated estrogens (PREMARIN) vaginal cream, Apply topically., Disp: , Rfl:  .  cyclobenzaprine (FLEXERIL) 10 MG tablet, Take  10 mg by mouth 3 (three) times daily as needed. for muscle spams, Disp: , Rfl:  .  Estradiol 10 MCG TABS vaginal tablet, Insert 1 tablet vaginally 2 times a week, Disp: , Rfl:  .  gabapentin (NEURONTIN) 600 MG tablet, Take 600 mg by mouth 4 (four) times daily as needed., Disp: , Rfl:  .  lubiprostone (AMITIZA) 24 MCG capsule, Take 1 capsule (24 mcg total) by mouth 2 (two) times daily with a meal., Disp: 60 capsule, Rfl: 0 .  Magnesium Citrate POWD, Take by mouth., Disp: , Rfl:  .  meclizine (ANTIVERT) 25 MG tablet, Take 1 tablet (25 mg total) by mouth 3 (three) times daily as needed for dizziness., Disp: 30 tablet, Rfl: 0 .  mometasone (NASONEX) 50 MCG/ACT nasal spray, Place 2 sprays into the nose as needed.  , Disp: , Rfl:  .  Omega-3 Fatty Acids (RA FISH OIL) 1000 MG CAPS, Take by mouth., Disp: , Rfl:  .  pantoprazole (PROTONIX) 40 MG tablet, Take 40 mg by mouth 2 (two) times daily., Disp: , Rfl:  .  rosuvastatin  (CRESTOR) 40 MG tablet, Take by mouth., Disp: , Rfl:  .  SUMAtriptan (IMITREX) 50 MG tablet, Take 50 mg by mouth as needed.  , Disp: , Rfl:  .  valACYclovir (VALTREX) 1000 MG tablet, Take 1,000 mg by mouth daily., Disp: , Rfl:  .  venlafaxine (EFFEXOR) 75 MG tablet, Take 75 mg by mouth 2 (two) times daily with a meal., Disp: , Rfl:     Family History  Problem Relation Age of Onset  . Heart attack Father 64  . Hypertension Father   . Stroke Father   . Coronary artery disease Other   . Other Mother        accident     Social History   Tobacco Use  . Smoking status: Never Smoker  . Smokeless tobacco: Never Used  . Tobacco comment: tobacco use- no   Vaping Use  . Vaping Use: Never used  Substance Use Topics  . Alcohol use: Yes    Alcohol/week: 7.0 standard drinks    Types: 7 Cans of beer per week  . Drug use: No    Allergies as of 03/27/2020 - Review Complete 03/27/2020  Allergen Reaction Noted  . Penicillins  02/16/2010  . Rofecoxib Other (See Comments) 04/27/2017    Review of Systems:    All systems reviewed and negative except where noted in HPI.   Physical Exam:  BP 123/74 (BP Location: Left Arm, Patient Position: Sitting, Cuff Size: Normal)   Pulse 87   Temp (!) 97.4 F (36.3 C) (Oral)   Ht 5' (1.524 m)   Wt 123 lb 12.8 oz (56.2 kg)   BMI 24.18 kg/m  No LMP recorded. Patient has had a hysterectomy.  General:   Alert,  Well-developed, well-nourished, pleasant and cooperative in NAD Head:  Normocephalic and atraumatic. Eyes:  Sclera clear, no icterus.   Conjunctiva pink. Ears:  Normal auditory acuity. Nose:  No deformity, discharge, or lesions. Mouth:  No deformity or lesions,oropharynx pink & moist. Neck:  Supple; no masses or thyromegaly. Lungs:  Respirations even and unlabored.  Clear throughout to auscultation.   No wheezes, crackles, or rhonchi. No acute distress. Heart:  Regular rate and rhythm; no murmurs, clicks, rubs, or gallops. Abdomen:  Normal  bowel sounds. Soft, non-tender, mildly distended without masses, hepatosplenomegaly or hernias noted.  No guarding or rebound tenderness.   Rectal: Not performed Msk:  Symmetrical without gross deformities. Good, equal movement & strength bilaterally. Pulses:  Normal pulses noted. Extremities:  No clubbing or edema.  No cyanosis. Neurologic:  Alert and oriented x3;  grossly normal neurologically. Skin:  Intact without significant lesions or rashes. No jaundice. Psych:  Alert and cooperative. Normal mood and affect.  Imaging Studies: Reviewed  Assessment and Plan:   Angela Ayala is a 74 y.o. female with no significant past medical history seen for follow-up of irritable bowel syndrome constipation predominant. Work-up has been negative so far.  She does not have other alarm signs or symptoms.  Anorectal manometry revealed possible pelvic floor dyssynergia.  Patient reports that she underwent biofeedback therapy in the past with minimal benefit.  Continue lubiprostone 24 MCG 2 times a day We will follow up on anorectal manometry referral to [facility]  Recommend surveillance colonoscopy in 2022   Follow up as needed   Cephas Darby, MD

## 2020-03-31 ENCOUNTER — Other Ambulatory Visit: Payer: Self-pay

## 2020-03-31 MED ORDER — LUBIPROSTONE 24 MCG PO CAPS: 24.0000 ug | ORAL_CAPSULE | Freq: Two times a day (BID) | ORAL | 4 refills | Status: AC

## 2021-07-14 IMAGING — CR ABDOMEN - 1 VIEW
2 series · 2 of 2 positions shown · non-contrast
Comparison: None.

CLINICAL DATA: Acute right lower quadrant abdominal pain.

EXAM:
ABDOMEN - 1 VIEW

[abdomen kub (1 of 2)]
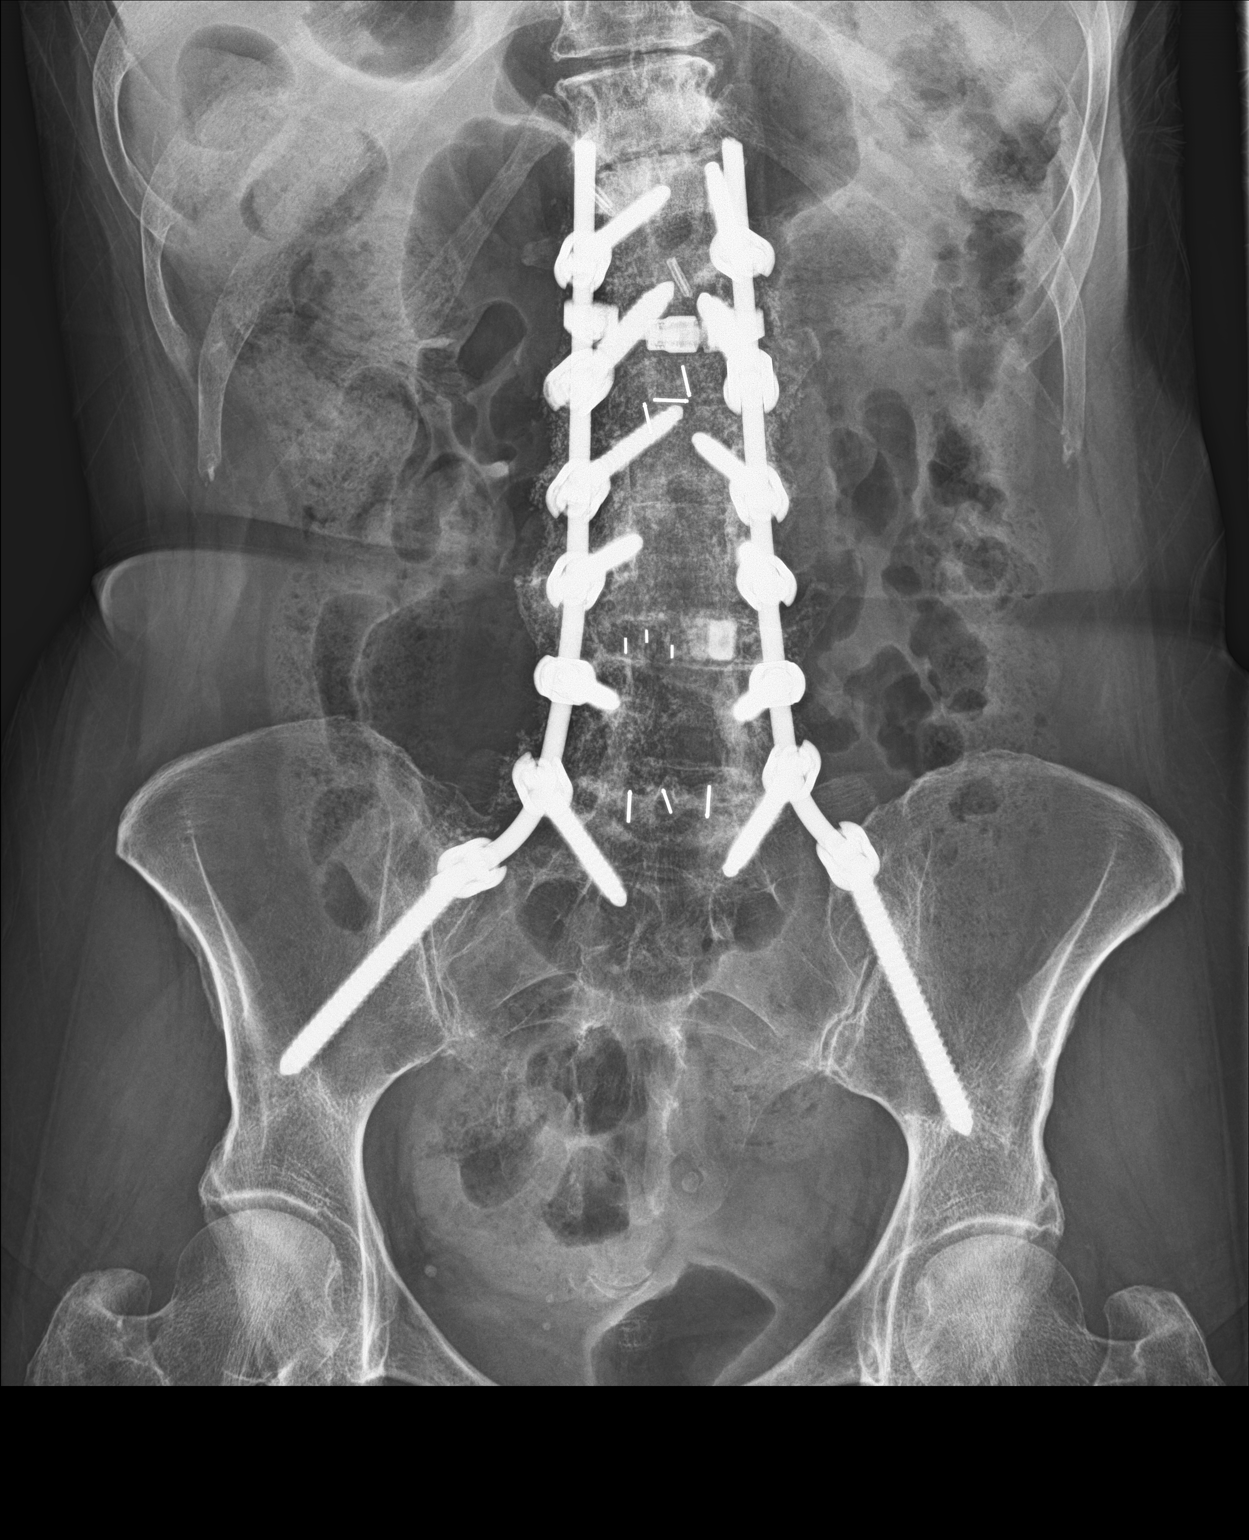

[abdomen kub (2 of 2)]
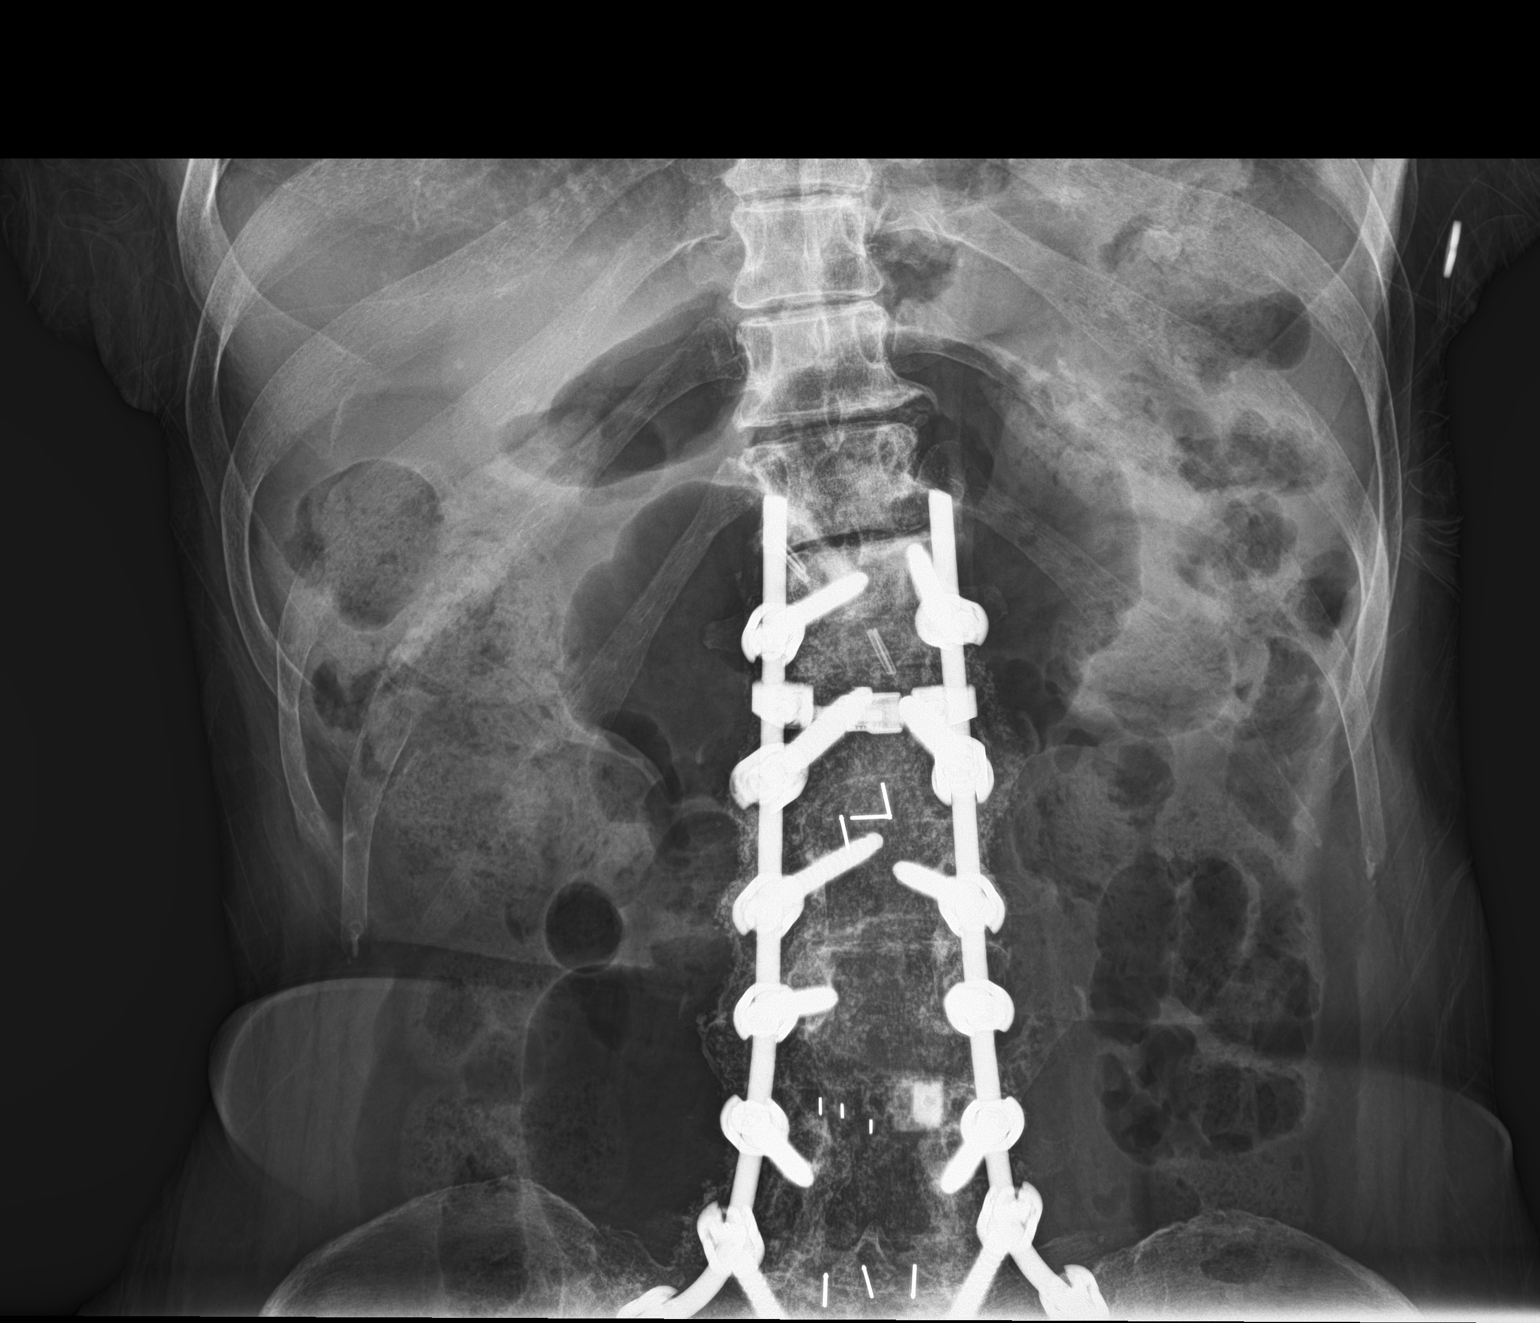

[2 of 2 positions shown; findings below may reference images not displayed]

FINDINGS: The bowel gas pattern is normal. Phleboliths are noted in the
pelvis. Status post surgical posterior fusion of lumbar spine and
sacrum.
IMPRESSION: No evidence of bowel obstruction or ileus.
